# Patient Record
Sex: Male | Born: 1995 | Race: White | Hispanic: No | Marital: Single | Smoking: Never smoker
Health system: Southern US, Community
[De-identification: ages and names within clinical notes are randomized; demographics above are authoritative.]

## PROBLEM LIST (undated history)

## (undated) DIAGNOSIS — J45909 Unspecified asthma, uncomplicated: Secondary | ICD-10-CM

## (undated) HISTORY — PX: WISDOM TOOTH EXTRACTION: SHX21

---
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
## 2015-12-07 ENCOUNTER — Encounter: Payer: Self-pay | Admitting: Emergency Medicine

## 2015-12-07 ENCOUNTER — Ambulatory Visit
Admission: EM | Admit: 2015-12-07 | Discharge: 2015-12-07 | Disposition: A | Discharge status: Home / Self Care | Payer: BLUE CROSS/BLUE SHIELD | Attending: Family Medicine | Admitting: Family Medicine

## 2015-12-07 DIAGNOSIS — R0981 Nasal congestion: Secondary | ICD-10-CM | POA: Diagnosis not present

## 2015-12-07 DIAGNOSIS — H6982 Other specified disorders of Eustachian tube, left ear: Secondary | ICD-10-CM

## 2015-12-07 HISTORY — DX: Unspecified asthma, uncomplicated: J45.909

## 2015-12-07 MED ORDER — BENZONATATE 100 MG PO CAPS: 100.0000 mg | ORAL_CAPSULE | Freq: Three times a day (TID) | ORAL | Status: DC | PRN

## 2015-12-07 MED ORDER — FLUTICASONE PROPIONATE 50 MCG/ACT NA SUSP: 2.0000 | Freq: Every day | NASAL | Status: DC

## 2015-12-07 NOTE — ED Notes (Signed)
Patient c/o sinus pressure and congestion for the past 2 weeks.  Patient denies fevers.

## 2015-12-07 NOTE — Discharge Instructions (Signed)
Ceterizine ( Zyrtec) 10 mg 1 tablet twice daily for 2-3 days then reduce to daily  Flonase ( Fluticazone ) 1 spray per nostril per day ( OK twice daily for 3-5 days) Benzonatate 100 mg 1 -2 as needed for cough  Robitussin DM  ( guaifenesin DM ) 1 tsp every 4-6 hours to liquefy mucous  Delay antibiotic RX unless increased symptom despite therapy, increased pain or develop fever- \ice and snow weekend- difficult to follow up  Drink fluids--steamy showers--water on the [place] !!    Barotitis Media Barotitis media is inflammation of your middle ear. This occurs when the auditory tube (eustachian tube) leading from the back of your nose (nasopharynx) to your eardrum is blocked. This blockage may result from a cold, environmental allergies, or an upper respiratory infection. Unresolved barotitis media may lead to damage or hearing loss (barotrauma), which may become permanent. HOME CARE INSTRUCTIONS   Use medicines as recommended by your health care provider. Over-the-counter medicines will help unblock the canal and can help during times of air travel.  Do not put anything into your ears to clean or unplug them. Eardrops will not be helpful.  Do not swim, dive, or fly until your health care provider says it is all right to do so. If these activities are necessary, chewing gum with frequent, forceful swallowing may help. It is also helpful to hold your nose and gently blow to pop your ears for equalizing pressure changes. This forces air into the eustachian tube.  Only take over-the-counter or prescription medicines for pain, discomfort, or fever as directed by your health care provider.  A decongestant may be helpful in decongesting the middle ear and make pressure equalization easier. SEEK MEDICAL CARE IF:  You experience a serious form of dizziness in which you feel as if the room is spinning and you feel nauseated (vertigo).  Your symptoms only involve one ear. SEEK IMMEDIATE MEDICAL  CARE IF:   You develop a severe headache, dizziness, or severe ear pain.  You have bloody or pus-like drainage from your ears.  You develop a fever.  Your problems do not improve or become worse. MAKE SURE YOU:   Understand these instructions.  Will watch your condition.  Will get help right away if you are not doing well or get worse.   This information is not intended to replace advice given to you by your health care provider. Make sure you discuss any questions you have with your health care provider.   Document Released: 11/14/2000 Document Revised: 09/07/2013 Document Reviewed: 06/14/2013 Elsevier Interactive Patient Education 2016 [employer] Allergic Rhinitis Allergic rhinitis is when the mucous membranes in the nose respond to allergens. Allergens are particles in the air that cause your body to have an allergic reaction. This causes you to release allergic antibodies. Through a chain of events, these eventually cause you to release histamine into the blood stream. Although meant to protect the body, it is this release of histamine that causes your discomfort, such as frequent sneezing, congestion, and an itchy, runny nose.  CAUSES Seasonal allergic rhinitis (hay fever) is caused by pollen allergens that may come from grasses, trees, and weeds. Year-round allergic rhinitis (perennial allergic rhinitis) is caused by allergens such as house dust mites, pet dander, and mold spores. SYMPTOMS  Nasal stuffiness (congestion).  Itchy, runny nose with sneezing and tearing of the eyes. DIAGNOSIS Your health care provider can help you determine the allergen or allergens that trigger your symptoms. If you  and your health care provider are unable to determine the allergen, skin or blood testing may be used. Your health care provider will diagnose your condition after taking your health history and performing a physical exam. Your health care provider may assess you for other related  conditions, such as asthma, pink eye, or an ear infection. TREATMENT Allergic rhinitis does not have a cure, but it can be controlled by:  Medicines that block allergy symptoms. These may include allergy shots, nasal sprays, and oral antihistamines.  Avoiding the allergen. Hay fever may often be treated with antihistamines in pill or nasal spray forms. Antihistamines block the effects of histamine. There are over-the-counter medicines that may help with nasal congestion and swelling around the eyes. Check with your health care provider before taking or giving this medicine. If avoiding the allergen or the medicine prescribed do not work, there are many new medicines your health care provider can prescribe. Stronger medicine may be used if initial measures are ineffective. Desensitizing injections can be used if medicine and avoidance does not work. Desensitization is when a patient is given ongoing shots until the body becomes less sensitive to the allergen. Make sure you follow up with your health care provider if problems continue. HOME CARE INSTRUCTIONS It is not possible to completely avoid allergens, but you can reduce your symptoms by taking steps to limit your exposure to them. It helps to know exactly what you are allergic to so that you can avoid your specific triggers. SEEK MEDICAL CARE IF:  You have a fever.  You develop a cough that does not stop easily (persistent).  You have shortness of breath.  You start wheezing.  Symptoms interfere with normal daily activities.   This information is not intended to replace advice given to you by your health care provider. Make sure you discuss any questions you have with your health care provider.   Document Released: 08/12/2001 Document Revised: 12/08/2014 Document Reviewed: 07/25/2013 Elsevier Interactive Patient Education [employer]

## 2015-12-09 ENCOUNTER — Encounter: Payer: Self-pay | Admitting: Physician Assistant

## 2015-12-09 NOTE — ED Provider Notes (Signed)
CSN: [id number]     Arrival date & time 12/07/15  1545 History   First MD Initiated Contact with Patient 12/07/15 1736     Chief Complaint  Patient presents with  . Facial Pain  . Nasal Congestion   (Consider location/radiation/quality/duration/timing/severity/associated sxs/prior Treatment) HPI 20 yo M presents with his Dad reporting "about 2 weeks" of nasal congestion and "cold" .  In the last 2 days had developed left facial pressure, maxillary region and left ear pressure-- feels underwater. No fever, no malaise. Tired of having a cold. Nonsmoker. Post nasal drip with cough. 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  Past Medical History  Diagnosis Date  . Asthma    Past Surgical History  Procedure Laterality Date  . Wisdom tooth extraction     Family History  Problem Relation Age of Onset  . Asthma Father    Social History  Substance Use Topics  . Smoking status: Never Smoker   . Smokeless tobacco: None  . Alcohol Use: No    Review of Systems Constitutional: No fever. No headache. Eyes: No visual changes. ENT:No sore throat. Nasal congestion left greater than right Cardiovascular:Negative for chest pain/palpitations Respiratory: Negative for shortness of breath Gastrointestinal: No abdominal pain. No nausea,vomiting, diarrhea Genitourinary: Negative for dysuria. Normal urination. Musculoskeletal: Negative for back pain. FROM extremities without pain Skin: Negative for rash Neurological: Negative for headache, focal weakness or numbness  Allergies  Penicillins  Home Medications   Prior to Admission medications   Medication Sig Start Date End Date Taking? Authorizing Provider  benzonatate (TESSALON) 100 MG capsule Take 1 capsule (100 mg total) by mouth 3 (three) times daily as needed. 12/07/15   Rae HalstedLaurie W Jakeisha Stricker, PA-C  fluticasone [facility](FLONASE) 50 MCG/ACT nasal spray Place 2 sprays into both nostrils  daily. 12/07/15   Rae HalstedLaurie W Eris Breck, PA-C   Meds Ordered and Administered this Visit  Medications - No data to display  BP 121/67 mmHg  Pulse 81  Temp(Src) 98 F (36.7 C) (Tympanic)  Resp 16  Ht 6' (1.829 m)  Wt 150 lb (68.04 kg)  BMI 20.34 kg/m2  SpO2 100% No data found.   Physical Exam  General: NAD, does not appear toxic - Afebrile- pressure has been for 24 hours, OTC intervention not yet used HEENT:normocephalic,atraumatic, mucous membranes moist,grossly normal hearing; left TM mildly retracted and dull; Right canal and TM WNL Eyes: EOMI, conjunctiva clear, conjugate gaze Neck: supple,no lymphadenopathy Resp : CT A, bilat; normal respiratory effort Card : RRR Abd:  Not distended Skin: no rash, skin intact MSK: no deformities, ambulatory without assistance Neuro : good attention,recall-good memory, no focal neuro deficits Psych: speech and behavior appropriate   ED Course  Procedures (including critical care time)  Labs Review Labs Reviewed - No data to display  Imaging Review No results found.   MDM   1. Eustachian tube dysfunction, left   2. Nasal congestion    Plan: diagnosis reviewed with patient/parent- illustrations reviewed Add Zyrtec/Flonase and Benzonatate - afebrile , pressure only 1 day without interventions- defer antibiotics Rx as per orders;  benefits, risks, potential side effects reviewed   Recommend supportive treatment with cyclic tylenol and ibuprofen Seek additional medical care if symptoms worsen or are not improving     Rae HalstedLaurie W Thanvi Blincoe, PA-C 12/09/15 2156

## 2016-09-12 ENCOUNTER — Ambulatory Visit
Admission: EM | Admit: 2016-09-12 | Discharge: 2016-09-12 | Disposition: A | Discharge status: Home / Self Care | Payer: Worker's Compensation | Attending: Family Medicine | Admitting: Family Medicine

## 2016-09-12 ENCOUNTER — Encounter: Payer: Self-pay | Admitting: Gynecology

## 2016-09-12 DIAGNOSIS — M545 Low back pain, unspecified: Secondary | ICD-10-CM

## 2016-09-12 MED ORDER — IBUPROFEN 800 MG PO TABS: 800.0000 mg | ORAL_TABLET | Freq: Three times a day (TID) | ORAL | 0 refills | Status: DC | PRN

## 2016-09-12 MED ORDER — CYCLOBENZAPRINE HCL 10 MG PO TABS: 10.0000 mg | ORAL_TABLET | Freq: Three times a day (TID) | ORAL | 0 refills | Status: DC | PRN

## 2016-09-12 MED ORDER — KETOROLAC TROMETHAMINE 60 MG/2ML IM SOLN
60.0000 mg | Freq: Once | INTRAMUSCULAR | Status: AC
  Administered 2016-09-12: 60 mg via INTRAMUSCULAR

## 2016-09-12 NOTE — ED Triage Notes (Signed)
Patient stated while at work today injury his lower back. Per patient after swinging a sledge hammer at work his lower back start hurting.

## 2016-09-12 NOTE — ED Provider Notes (Signed)
[facility] ____________________________________________  Time seen: Approximately 6:18 PM  I have reviewed the triage vital signs and the nursing notes.   HISTORY  Chief Complaint Back Pain   HPI  Thomas Huff is a 20 y.o. male present with father at bedside for the complaints of low back pain. Patient reports that earlier today personally 9 AM he was at work, and states that he had to swing a sledgehammer and had onset of low back pain during this activity. Patient reports low back pain has continued. Patient states pain is primarily with movement. Patient states if he sits completely still he does not have any pain. Denies any pain with bending forward or sitting back up, but reports pain is primarily with twisting movements. Denies any pain radiation. Denies any numbness or tingling sensation. Patient reports he took ibuprofen over-the-counter earlier today without any resolution. Patient states at this time sitting still he does not have any pain. Denies any fall to the ground, direct trauma or other injury. Denies any awkward movement. Denies any history of back pain or problems. Denies head injury or loss consciousness.  Denies urinary or bowel retention or incontinence, extremity pain, extremity weakness, paresthesias, abdominal pain or other complaints.   Past Medical History:  Diagnosis Date  . Asthma     There are no active problems to display for this patient.   Past Surgical History:  Procedure Laterality Date  . WISDOM TOOTH EXTRACTION      Current Outpatient Rx  . Order #: [id number] Class: Normal  . Order #: [id number] Class: Normal     Current Facility-Administered Medications:  .  ketorolac (TORADOL) injection 60 mg, 60 mg, Intramuscular, Once, Renford Dills, NP  Current Outpatient Prescriptions:  .  benzonatate (TESSALON) 100 MG capsule, Take 1 capsule (100 mg total) by mouth 3 (three) times daily as needed., Disp: 30 capsule, Rfl: 0 .   fluticasone (FLONASE) 50 MCG/ACT nasal spray, Place 2 sprays into both nostrils daily., Disp: 16 g, Rfl: 2  Allergies Penicillins  Family History  Problem Relation Age of Onset  . Asthma Father     Social History Social History  Substance Use Topics  . Smoking status: Never Smoker  . Smokeless tobacco: Never Used  . Alcohol use No    Review of Systems Constitutional: No fever/chills Eyes: No visual changes. ENT: No sore throat. Cardiovascular: Denies chest pain. Respiratory: Denies shortness of breath. Gastrointestinal: No abdominal pain.  No nausea, no vomiting.  No diarrhea.  No constipation. Genitourinary: Negative for dysuria. Musculoskeletal: Positive for back pain. Skin: Negative for rash. Neurological: Negative for headaches, focal weakness or numbness.  10-point ROS otherwise negative.  ____________________________________________   PHYSICAL EXAM:  VITAL SIGNS: ED Triage Vitals  Enc Vitals Group     BP 09/12/16 1754 123/66     Pulse Rate 09/12/16 1754 69     Resp 09/12/16 1754 16     Temp 09/12/16 1754 97.9 F (36.6 C)     Temp Source 09/12/16 1754 Oral     SpO2 09/12/16 1754 100 %     Weight 09/12/16 1755 160 lb (72.6 kg)     Height 09/12/16 1755 6' (1.829 m)     Head Circumference --      Peak Flow --      Pain Score 09/12/16 1756 6     Pain Loc --      Pain Edu? --      Excl. in GC? --  Constitutional: Alert and oriented. Well appearing and in no acute distress. Eyes: Conjunctivae are normal. PERRL. EOMI. ENT      Head: Normocephalic and atraumatic.      Nose: No congestion/rhinnorhea.      Mouth/Throat: Mucous membranes are moist.Oropharynx non-erythematous. Neck: No stridor. Supple without meningismus.  Hematological/Lymphatic/Immunilogical: No cervical lymphadenopathy. Cardiovascular: Normal rate, regular rhythm. Grossly normal heart sounds.  Good peripheral circulation. Respiratory: Normal respiratory effort without tachypnea nor  retractions. Breath sounds are clear and equal bilaterally. No wheezes/rales/rhonchi.. Gastrointestinal: Soft and nontender. No distention. No CVA tenderness. Musculoskeletal:  Nontender with normal range of motion in all extremities. No midline cervical Or thoracic tenderness to palpation. Bilateral pedal pulses equal and easily palpated. Except: Patient with minimal midline lumbar tenderness to palpation, diffuse bilateral paralumbar lumbar tenderness to direct palpation, muscle spasm noted to bilateral paralumbar musculature noted with right and left lumbar twisting rotation, no pain with lumbar flexion or extension, full range of motion present, no saddle anesthesia, normal bilateral achilles reflexes, negative bilateral straight leg raise test negative , changes positions quickly from lying to standing.  Neurologic:  Normal speech and language. No gross focal neurologic deficits are appreciated. Speech is normal. No gait instability.  Skin:  Skin is warm, dry and intact. No rash noted. Psychiatric: Mood and affect are normal. Speech and behavior are normal. Patient exhibits appropriate insight and judgment   ___________________________________________   LABS (all labs ordered are listed, but only abnormal results are displayed)  Labs Reviewed - No data to display  RADIOLOGY  No results found. ____________________________________________   PROCEDURES Procedures   __________________________________________   INITIAL IMPRESSION / ASSESSMENT AND PLAN / ED COURSE  Pertinent labs & imaging results that were available during my care of the patient were reviewed by me and considered in my medical decision making (see chart for details).  Well-appearing patient. No acute distress. Presents for the complaints of low back pain after swimming a sledgehammer at work this morning. Denies fall or direct trauma. Denies history of similar. 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 Patient  denies pain when sitting still at rest. Patient states pain is primarily with right and left rotation and twisting movements. Minimal midline tenderness, palpable muscle spasms on exam, per patient pain is fully reproducible by direct palpation to bilateral paralumbar musculature. Discussed in detail with patient and father regarding evaluation and treatment, patient and father declines x-ray at this time and states will follow-up and have x-ray if pain continues.   60 mg IM Toradol given once in urgent care. Suspect lumbar strain. Will treat patient with oral ibuprofen as needed as well as Flexeril as needed. Patient reports he has this weekend off of work. Directed to take muscle relaxant as prescribed over the weekend, however discuss once returning to work only take muscle relaxant at night as needed. Encouraged supportive care, rest, ice and stretching. Encouraged patient to follow-up this coming week with Francesco Sorommie Anne Moore FNP, and further stated that restrictions including no heavy lifting, climbing or bending to stay in effect until follow-up. Discussed indication, risks and benefits of medications with patient.  Discussed follow up with Primary care physician this week. Discussed follow up and return parameters including no resolution or any worsening concerns. Patient verbalized understanding and agreed to plan.   ____________________________________________   FINAL CLINICAL IMPRESSION(S) / ED DIAGNOSES  Final diagnoses:  Strain of lumbar region, initial encounter     New Prescriptions   No medications on file  Note: This dictation was prepared with Dragon dictation along with smaller phrase technology. Any transcriptional errors that result from this process are unintentional.    Clinical Course      Renford Dills, NP 09/12/16 2148

## 2016-09-12 NOTE — Discharge Instructions (Signed)
Take medication as prescribed. Rest. Stretch.   Follow up with Tommie Maximiano Coss FNP this coming week.   Follow up with your primary care physician this week as needed. Return to Urgent care for new or worsening concerns.

## 2017-12-25 ENCOUNTER — Emergency Department: Payer: Worker's Compensation

## 2017-12-25 ENCOUNTER — Encounter: Payer: Self-pay | Admitting: Emergency Medicine

## 2017-12-25 ENCOUNTER — Emergency Department
Admission: EM | Admit: 2017-12-25 | Discharge: 2017-12-25 | Disposition: A | Discharge status: Home / Self Care | Payer: Worker's Compensation | Attending: Emergency Medicine | Admitting: Emergency Medicine

## 2017-12-25 ENCOUNTER — Other Ambulatory Visit: Payer: Self-pay

## 2017-12-25 DIAGNOSIS — J45909 Unspecified asthma, uncomplicated: Secondary | ICD-10-CM | POA: Insufficient documentation

## 2017-12-25 DIAGNOSIS — S62339A Displaced fracture of neck of unspecified metacarpal bone, initial encounter for closed fracture: Secondary | ICD-10-CM | POA: Insufficient documentation

## 2017-12-25 DIAGNOSIS — Y939 Activity, unspecified: Secondary | ICD-10-CM | POA: Insufficient documentation

## 2017-12-25 DIAGNOSIS — Z880 Allergy status to penicillin: Secondary | ICD-10-CM | POA: Insufficient documentation

## 2017-12-25 DIAGNOSIS — W010XXA Fall on same level from slipping, tripping and stumbling without subsequent striking against object, initial encounter: Secondary | ICD-10-CM | POA: Insufficient documentation

## 2017-12-25 DIAGNOSIS — Y929 Unspecified place or not applicable: Secondary | ICD-10-CM | POA: Diagnosis not present

## 2017-12-25 DIAGNOSIS — Z79899 Other long term (current) drug therapy: Secondary | ICD-10-CM | POA: Insufficient documentation

## 2017-12-25 DIAGNOSIS — S6992XA Unspecified injury of left wrist, hand and finger(s), initial encounter: Secondary | ICD-10-CM | POA: Diagnosis present

## 2017-12-25 DIAGNOSIS — Y998 Other external cause status: Secondary | ICD-10-CM | POA: Insufficient documentation

## 2017-12-25 MED ORDER — IBUPROFEN 800 MG PO TABS: 800.0000 mg | ORAL_TABLET | Freq: Three times a day (TID) | ORAL | 0 refills | Status: DC | PRN

## 2017-12-25 MED ORDER — OXYCODONE-ACETAMINOPHEN 5-325 MG PO TABS: 1.0000 | ORAL_TABLET | ORAL | 0 refills | Status: DC | PRN

## 2017-12-25 MED ORDER — FENTANYL CITRATE (PF) 100 MCG/2ML IJ SOLN
50.0000 ug | Freq: Once | INTRAMUSCULAR | Status: AC
  Administered 2017-12-25: 50 ug via INTRAVENOUS
  Filled 2017-12-25: qty 2

## 2017-12-25 NOTE — ED Provider Notes (Signed)
[facility] Emergency Department Provider Note  ____________________________________________  Time seen: Approximately 4:01 PM  I have reviewed the triage vital signs and the nursing notes.   HISTORY  Chief Complaint Hand Injury    HPI DEWAUN KINZLER is a 22 y.o. male that presents to the emergency department for evaluation of hand injury at work. Patient slipped and put his hand out to catch himself on a machine. No additional injuries. No numbness, tingling.    Past Medical History:  Diagnosis Date  . Asthma     There are no active problems to display for this patient.   Past Surgical History:  Procedure Laterality Date  . WISDOM TOOTH EXTRACTION      Prior to Admission medications   Medication Sig Start Date End Date Taking? Authorizing Provider  benzonatate (TESSALON) 100 MG capsule Take 1 capsule (100 mg total) by mouth 3 (three) times daily as needed. 12/07/15   Rae Halsted, PA-C  cyclobenzaprine (FLEXERIL) 10 MG tablet Take 1 tablet (10 mg total) by mouth 3 (three) times daily as needed for muscle spasms (do not drive or operate machinery while taking as can cause drowsiness. Do not take at work.). 09/12/16   Renford Dills, NP  fluticasone (FLONASE) 50 MCG/ACT nasal spray Place 2 sprays into both nostrils daily. 12/07/15   Rae Halsted, PA-C  ibuprofen (ADVIL,MOTRIN) 800 MG tablet Take 1 tablet (800 mg total) by mouth every 8 (eight) hours as needed. 12/25/17   Enid Derry, PA-C  oxyCODONE-acetaminophen (PERCOCET) 5-325 MG tablet Take 1 tablet by mouth every 4 (four) hours as needed for severe pain. 12/25/17   Enid Derry, PA-C    Allergies Penicillins  Family History  Problem Relation Age of Onset  . Asthma Father     Social History Social History   Tobacco Use  . Smoking status: Never Smoker  . Smokeless tobacco: Never Used  Substance Use Topics  . Alcohol use: No  . Drug use: Not on file     Review of Systems   Cardiovascular: No chest pain. Respiratory: No SOB. Musculoskeletal: Positive for hand pain Skin: Negative for rash, abrasions, lacerations, ecchymosis. Neurological: Negative for numbness or tingling   ____________________________________________   PHYSICAL EXAM:  VITAL SIGNS: ED Triage Vitals  Enc Vitals Group     BP 12/25/17 1544 (!) 157/87     Pulse Rate 12/25/17 1544 79     Resp 12/25/17 1544 16     Temp 12/25/17 1544 98.5 F (36.9 C)     Temp Source 12/25/17 1544 Oral     SpO2 12/25/17 1544 100 %     Weight 12/25/17 1545 160 lb (72.6 kg)     Height 12/25/17 1545 6' (1.829 m)     Head Circumference --      Peak Flow --      Pain Score 12/25/17 1545 6     Pain Loc --      Pain Edu? --      Excl. in GC? --      Constitutional: Alert and oriented. Well appearing and in no acute distress. Eyes: Conjunctivae are normal. PERRL. EOMI. Head: Atraumatic. ENT:      Ears:      Nose: No congestion/rhinnorhea.      Mouth/Throat: Mucous membranes are moist.  Neck: No stridor. Cardiovascular: Normal rate, regular rhythm.  Good peripheral circulation.  Symmetric radial pulses bilaterally. Respiratory: Normal respiratory effort without tachypnea or retractions. Lungs CTAB. Good air entry  to the bases with no decreased or absent breath sounds. Musculoskeletal: Full range of motion to all extremities.  Tenderness to palpation over fifth metacarpal.  No visible swelling. Neurologic:  Normal speech and language. No gross focal neurologic deficits are appreciated.  Skin:  Skin is warm, dry and intact. No rash noted.   ____________________________________________   LABS (all labs ordered are listed, but only abnormal results are displayed)  Labs Reviewed - No data to display ____________________________________________  EKG   ____________________________________________  RADIOLOGY Lexine BatonI, Araeya Lamb, personally viewed and evaluated these images (plain radiographs) as part  of my medical decision making, as well as reviewing the written report by the radiologist. Fifth metacarpal fracture Dg Hand Complete Left  Result Date: 12/25/2017 CLINICAL DATA:  Hand injury with pain. EXAM: LEFT HAND - COMPLETE 3+ VIEW COMPARISON:  None. FINDINGS: Transverse fracture of the fifth metacarpal noted near the junction of the middle and distal thirds. There is apex posterior angulation. No evidence for bony over riding. No other acute fracture evident no worrisome lytic or sclerotic osseous abnormality. IMPRESSION: Diaphyseal fracture of the fifth metacarpal with apex posterior angulation. Electronically Signed   By: Kennith CenterEric  Mansell M.D.   On: 12/25/2017 16:21    ____________________________________________    PROCEDURES  Procedure(s) performed:    Procedures    Medications  fentaNYL (SUBLIMAZE) injection 50 mcg (50 mcg Intravenous Given 12/25/17 1615)     ____________________________________________   INITIAL IMPRESSION / ASSESSMENT AND PLAN / ED COURSE  Pertinent labs & imaging results that were available during my care of the patient were reviewed by me and considered in my medical decision making (see chart for details).  Review of the [place] CSRS was performed in accordance of the [facility] prior to dispensing any controlled drugs.  PatientPatient's diagnosis is consistent with fifth metacarpal fracture.  Vital signs and exam are reassuring.  X-ray consistent with fracture.  Hand is neurovascularly intact.  Ulnar gutter splint was placed.  Sling was given.  Patient will be discharged home with prescriptions for ibuprofen and a short course of Percocet. Patient is to follow up with orthopedics as directed. Patient is given ED precautions to return to the ED for any worsening or new symptoms.     ____________________________________________  FINAL CLINICAL IMPRESSION(S) / ED DIAGNOSES  Final diagnoses:  Closed boxer's fracture, initial encounter      NEW  MEDICATIONS STARTED DURING THIS VISIT:  ED Discharge Orders        Ordered    ibuprofen (ADVIL,MOTRIN) 800 MG tablet  Every 8 hours PRN     12/25/17 1707    oxyCODONE-acetaminophen (PERCOCET) 5-325 MG tablet  Every 4 hours PRN     12/25/17 1707          This chart was dictated using voice recognition software/Dragon. Despite best efforts to proofread, errors can occur which can change the meaning. Any change was purely unintentional.    Enid DerryWagner, Carisa Backhaus, PA-C 12/25/17 1959    Minna AntisPaduchowski, Kevin, MD 12/25/17 2329

## 2017-12-25 NOTE — ED Triage Notes (Signed)
Pt to ED via POV, pt was at work today and slipped and fell, pt put his hand out trying to catch himself and hurt his hand. Pt in NAD at this time.

## 2018-01-11 ENCOUNTER — Encounter
Admission: RE | Admit: 2018-01-11 | Discharge: 2018-01-11 | Disposition: A | Discharge status: Home / Self Care | Payer: Self-pay | Source: Ambulatory Visit | Attending: Orthopedic Surgery | Admitting: Orthopedic Surgery

## 2018-01-11 ENCOUNTER — Other Ambulatory Visit: Payer: Self-pay

## 2018-01-11 NOTE — Patient Instructions (Signed)
Your procedure is scheduled on: 01-14-18 THURSDAY Report to Same Day Surgery 2nd floor medical mall [facility](Medical Mall Entrance-take elevator on left to 2nd floor.  Check in with surgery information desk.) To find out your arrival time please call [phone] between 1PM - 3PM on [facility]  Remember: Instructions that are not followed completely may result in serious medical risk, up to and including death, or upon the discretion of your surgeon and anesthesiologist your surgery may need to be rescheduled.    _x___ 1. Do not eat food after midnight the night before your procedure. NO GUM OR CANDY AFTER MIDNIGHT.  You may drink clear liquids up to 2 hours before you are scheduled to arrive at the hospital for your procedure.  Do not drink clear liquids within 2 hours of your scheduled arrival to the hospital.  Clear liquids include  --Water or Apple juice without pulp  --Clear carbohydrate beverage such as ClearFast or Gatorade  --Black Coffee or Clear Tea (No milk, no creamers, do not add anything to the coffee or Tea)     __x__ 2. No Alcohol for 24 hours before or after surgery.   __x__3. No Smoking or e-cigarettes for 24 prior to surgery.  Do not use any chewable tobacco products for at least 6 hour prior to surgery   ____  4. Bring all medications with you on the day of surgery if instructed.    __x__ 5. Notify your doctor if there is any change in your medical condition     (cold, fever, infections).    x___6. On the morning of surgery brush your teeth with toothpaste and water.  You may rinse your mouth with mouth wash if you wish.  Do not swallow any toothpaste or mouthwash.   Do not wear jewelry, make-up, hairpins, clips or nail polish.  Do not wear lotions, powders, or perfumes. You may wear deodorant.  Do not shave 48 hours prior to surgery. Men may shave face and neck.  Do not bring valuables to the hospital.    [facility] belongings or  valuables.               Contacts, dentures or bridgework may not be worn into surgery.  Leave your suitcase in the car. After surgery it may be brought to your room.  For patients admitted to the hospital, discharge time is determined by your treatment team.  _  Patients discharged the day of surgery will not be allowed to drive home.  You will need someone to drive you home and stay with you the night of your procedure.     ____ Take anti-hypertensive listed below, cardiac, seizure, asthma, anti-reflux and psychiatric medicines. These include:  1. USE YOUR ALBUTEROL INHALER AM OF SURGERY AND BRING INHALER TO HOSPITAL  2.  3.  4.  5.  6.  ____Fleets enema or Magnesium Citrate as directed.   ____ Use CHG Soap or sage wipes as directed on instruction sheet   __X__ Use inhalers on the day of surgery and bring to hospital day of surgery-USE YOUR ALBUTEROL INHALER AND BRING TO HOSPITAL  ____ Stop Metformin and Janumet 2 days prior to surgery.    ____ Take 1/2 of usual insulin dose the night before surgery and none on the morning surgery.   ____ Follow recommendations from Cardiologist, Pulmonologist or PCP regarding stopping Aspirin, Coumadin, Plavix ,Eliquis, Effient, or Pradaxa, and Pletal.  X____Stop Anti-inflammatories such as Advil, Aleve,  Ibuprofen, Motrin, Naproxen, Naprosyn, Goodies powders or aspirin products NOW-OK to take Tylenol    ____ Stop supplements until after surgery.    ____ Bring C-Pap to the hospital.

## 2018-01-13 MED ORDER — CLINDAMYCIN PHOSPHATE 900 MG/50ML IV SOLN: 900.0000 mg | Freq: Once | INTRAVENOUS | Status: DC

## 2018-01-14 ENCOUNTER — Encounter
Admission: RE | Disposition: A | Discharge status: Home / Self Care | Payer: Self-pay | Source: Ambulatory Visit | Attending: Orthopedic Surgery

## 2018-01-14 ENCOUNTER — Ambulatory Visit: Payer: Worker's Compensation | Admitting: Anesthesiology

## 2018-01-14 ENCOUNTER — Other Ambulatory Visit: Payer: Self-pay

## 2018-01-14 ENCOUNTER — Encounter: Payer: Self-pay | Admitting: Anesthesiology

## 2018-01-14 ENCOUNTER — Ambulatory Visit
Admission: RE | Admit: 2018-01-14 | Discharge: 2018-01-14 | Disposition: A | Discharge status: Home / Self Care | Payer: Worker's Compensation | Source: Ambulatory Visit | Attending: Orthopedic Surgery | Admitting: Orthopedic Surgery

## 2018-01-14 ENCOUNTER — Ambulatory Visit: Payer: Worker's Compensation

## 2018-01-14 DIAGNOSIS — Z8781 Personal history of (healed) traumatic fracture: Secondary | ICD-10-CM

## 2018-01-14 DIAGNOSIS — Y990 Civilian activity done for income or pay: Secondary | ICD-10-CM | POA: Diagnosis not present

## 2018-01-14 DIAGNOSIS — W19XXXA Unspecified fall, initial encounter: Secondary | ICD-10-CM | POA: Insufficient documentation

## 2018-01-14 DIAGNOSIS — Z9889 Other specified postprocedural states: Secondary | ICD-10-CM

## 2018-01-14 DIAGNOSIS — Z79899 Other long term (current) drug therapy: Secondary | ICD-10-CM | POA: Insufficient documentation

## 2018-01-14 DIAGNOSIS — J45909 Unspecified asthma, uncomplicated: Secondary | ICD-10-CM | POA: Diagnosis not present

## 2018-01-14 DIAGNOSIS — S62357A Nondisplaced fracture of shaft of fifth metacarpal bone, left hand, initial encounter for closed fracture: Secondary | ICD-10-CM | POA: Insufficient documentation

## 2018-01-14 DIAGNOSIS — Y9289 Other specified places as the place of occurrence of the external cause: Secondary | ICD-10-CM | POA: Diagnosis not present

## 2018-01-14 HISTORY — PX: OPEN REDUCTION INTERNAL FIXATION (ORIF) METACARPAL: SHX6234

## 2018-01-14 SURGERY — OPEN REDUCTION INTERNAL FIXATION (ORIF) METACARPAL
Anesthesia: General | Site: Hand | Laterality: Left | Wound class: Clean

## 2018-01-14 MED ORDER — FENTANYL CITRATE (PF) 100 MCG/2ML IJ SOLN
25.0000 ug | INTRAMUSCULAR | Status: DC | PRN
  Administered 2018-01-14 (×4): 25 ug via INTRAVENOUS

## 2018-01-14 MED ORDER — SODIUM CHLORIDE 0.9 % IV SOLN: INTRAVENOUS | Status: DC

## 2018-01-14 MED ORDER — DEXAMETHASONE SODIUM PHOSPHATE 10 MG/ML IJ SOLN
INTRAMUSCULAR | Status: AC
  Filled 2018-01-14: qty 1

## 2018-01-14 MED ORDER — SUGAMMADEX SODIUM 200 MG/2ML IV SOLN
INTRAVENOUS | Status: AC
  Filled 2018-01-14: qty 2

## 2018-01-14 MED ORDER — FENTANYL CITRATE (PF) 100 MCG/2ML IJ SOLN
INTRAMUSCULAR | Status: DC | PRN
  Administered 2018-01-14: 100 ug via INTRAVENOUS

## 2018-01-14 MED ORDER — LIDOCAINE HCL (PF) 2 % IJ SOLN
INTRAMUSCULAR | Status: AC
  Filled 2018-01-14: qty 10

## 2018-01-14 MED ORDER — SUGAMMADEX SODIUM 200 MG/2ML IV SOLN
INTRAVENOUS | Status: DC | PRN
  Administered 2018-01-14: 156 mg via INTRAVENOUS

## 2018-01-14 MED ORDER — NEOMYCIN-POLYMYXIN B GU 40-200000 IR SOLN
Status: DC | PRN
  Administered 2018-01-14: 2 mL

## 2018-01-14 MED ORDER — FENTANYL CITRATE (PF) 100 MCG/2ML IJ SOLN
INTRAMUSCULAR | Status: AC
  Filled 2018-01-14: qty 2

## 2018-01-14 MED ORDER — HYDROCODONE-ACETAMINOPHEN 5-325 MG PO TABS
1.0000 | ORAL_TABLET | ORAL | Status: DC | PRN
  Administered 2018-01-14: 1 via ORAL

## 2018-01-14 MED ORDER — LIDOCAINE HCL (CARDIAC) 20 MG/ML IV SOLN
INTRAVENOUS | Status: DC | PRN
  Administered 2018-01-14: 100 mg via INTRAVENOUS

## 2018-01-14 MED ORDER — ONDANSETRON HCL 4 MG PO TABS: 4.0000 mg | ORAL_TABLET | Freq: Four times a day (QID) | ORAL | Status: DC | PRN

## 2018-01-14 MED ORDER — DEXAMETHASONE SODIUM PHOSPHATE 10 MG/ML IJ SOLN
INTRAMUSCULAR | Status: DC | PRN
  Administered 2018-01-14: 10 mg via INTRAVENOUS

## 2018-01-14 MED ORDER — ONDANSETRON HCL 4 MG/2ML IJ SOLN: 4.0000 mg | Freq: Four times a day (QID) | INTRAMUSCULAR | Status: DC | PRN

## 2018-01-14 MED ORDER — FAMOTIDINE 20 MG PO TABS
ORAL_TABLET | ORAL | Status: AC
  Filled 2018-01-14: qty 1

## 2018-01-14 MED ORDER — ONDANSETRON HCL 4 MG/2ML IJ SOLN: 4.0000 mg | Freq: Once | INTRAMUSCULAR | Status: DC | PRN

## 2018-01-14 MED ORDER — FENTANYL CITRATE (PF) 100 MCG/2ML IJ SOLN
INTRAMUSCULAR | Status: AC
  Administered 2018-01-14: 25 ug via INTRAVENOUS
  Filled 2018-01-14: qty 2

## 2018-01-14 MED ORDER — MIDAZOLAM HCL 2 MG/2ML IJ SOLN
INTRAMUSCULAR | Status: AC
  Filled 2018-01-14: qty 2

## 2018-01-14 MED ORDER — METOCLOPRAMIDE HCL 5 MG/ML IJ SOLN: 5.0000 mg | Freq: Three times a day (TID) | INTRAMUSCULAR | Status: DC | PRN

## 2018-01-14 MED ORDER — ROCURONIUM BROMIDE 50 MG/5ML IV SOLN
INTRAVENOUS | Status: AC
  Filled 2018-01-14: qty 1

## 2018-01-14 MED ORDER — ONDANSETRON HCL 4 MG/2ML IJ SOLN
INTRAMUSCULAR | Status: DC | PRN
  Administered 2018-01-14: 4 mg via INTRAVENOUS

## 2018-01-14 MED ORDER — MIDAZOLAM HCL 2 MG/2ML IJ SOLN
INTRAMUSCULAR | Status: DC | PRN
  Administered 2018-01-14: 2 mg via INTRAVENOUS

## 2018-01-14 MED ORDER — PROPOFOL 10 MG/ML IV BOLUS
INTRAVENOUS | Status: AC
  Filled 2018-01-14: qty 20

## 2018-01-14 MED ORDER — BUPIVACAINE HCL (PF) 0.5 % IJ SOLN
INTRAMUSCULAR | Status: DC | PRN
  Administered 2018-01-14: 10 mL

## 2018-01-14 MED ORDER — BUPIVACAINE HCL (PF) 0.5 % IJ SOLN
INTRAMUSCULAR | Status: AC
  Filled 2018-01-14: qty 30

## 2018-01-14 MED ORDER — METOCLOPRAMIDE HCL 10 MG PO TABS: 5.0000 mg | ORAL_TABLET | Freq: Three times a day (TID) | ORAL | Status: DC | PRN

## 2018-01-14 MED ORDER — ONDANSETRON HCL 4 MG/2ML IJ SOLN
INTRAMUSCULAR | Status: AC
  Filled 2018-01-14: qty 2

## 2018-01-14 MED ORDER — CLINDAMYCIN PHOSPHATE 900 MG/50ML IV SOLN
INTRAVENOUS | Status: AC
  Filled 2018-01-14: qty 50

## 2018-01-14 MED ORDER — FAMOTIDINE 20 MG PO TABS
20.0000 mg | ORAL_TABLET | Freq: Once | ORAL | Status: AC
  Administered 2018-01-14: 20 mg via ORAL

## 2018-01-14 MED ORDER — SUCCINYLCHOLINE CHLORIDE 20 MG/ML IJ SOLN
INTRAMUSCULAR | Status: AC
Start: 2018-01-14 — End: ?
  Filled 2018-01-14: qty 1

## 2018-01-14 MED ORDER — HYDROCODONE-ACETAMINOPHEN 5-325 MG PO TABS
ORAL_TABLET | ORAL | Status: AC
  Filled 2018-01-14: qty 1

## 2018-01-14 MED ORDER — SUCCINYLCHOLINE CHLORIDE 20 MG/ML IJ SOLN
INTRAMUSCULAR | Status: DC | PRN
  Administered 2018-01-14: 120 mg via INTRAVENOUS

## 2018-01-14 MED ORDER — ROCURONIUM BROMIDE 100 MG/10ML IV SOLN
INTRAVENOUS | Status: DC | PRN
  Administered 2018-01-14: 50 mg via INTRAVENOUS

## 2018-01-14 MED ORDER — NEOMYCIN-POLYMYXIN B GU 40-200000 IR SOLN
Status: AC
  Filled 2018-01-14: qty 2

## 2018-01-14 MED ORDER — LACTATED RINGERS IV SOLN
INTRAVENOUS | Status: DC
  Administered 2018-01-14 (×2): via INTRAVENOUS

## 2018-01-14 MED ORDER — PROPOFOL 10 MG/ML IV BOLUS
INTRAVENOUS | Status: DC | PRN
  Administered 2018-01-14: 200 mg via INTRAVENOUS

## 2018-01-14 SURGICAL SUPPLY — 42 items
BANDAGE ELASTIC 4 LF NS (GAUZE/BANDAGES/DRESSINGS) ×3 IMPLANT
BIT DRILL 1.1 (BIT) ×1
BIT DRILL 1.1MM (BIT) ×1
BIT DRILL 60X20X1.1XQC TMX (BIT) ×1 IMPLANT
BIT DRL 60X20X1.1XQC TMX (BIT) ×1
BNDG GAUZE 1X2.1 STRL (MISCELLANEOUS) ×3 IMPLANT
CHLORAPREP W/TINT 26ML (MISCELLANEOUS) ×3 IMPLANT
CUFF TOURN 18 STER (MISCELLANEOUS) IMPLANT
DRAPE FLUOR MINI C-ARM 54X84 (DRAPES) ×3 IMPLANT
ELECT CAUTERY BLADE 6.4 (BLADE) ×3 IMPLANT
ELECT REM PT RETURN 9FT ADLT (ELECTROSURGICAL) ×3
ELECTRODE REM PT RTRN 9FT ADLT (ELECTROSURGICAL) ×1 IMPLANT
GAUZE PETRO XEROFOAM 1X8 (MISCELLANEOUS) ×3 IMPLANT
GAUZE SPONGE 4X4 12PLY STRL (GAUZE/BANDAGES/DRESSINGS) ×3 IMPLANT
GLOVE BIOGEL PI IND STRL 7.0 (GLOVE) ×2 IMPLANT
GLOVE BIOGEL PI INDICATOR 7.0 (GLOVE) ×4
GLOVE SURG SYN 9.0  PF PI (GLOVE) ×2
GLOVE SURG SYN 9.0 PF PI (GLOVE) ×1 IMPLANT
GOWN SRG 2XL LVL 4 RGLN SLV (GOWNS) ×1 IMPLANT
GOWN STRL NON-REIN 2XL LVL4 (GOWNS) ×2
GOWN STRL REUS W/ TWL LRG LVL3 (GOWN DISPOSABLE) ×1 IMPLANT
GOWN STRL REUS W/TWL LRG LVL3 (GOWN DISPOSABLE) ×2
KIT TURNOVER KIT A (KITS) ×3 IMPLANT
NEEDLE FILTER BLUNT 18X 1/2SAF (NEEDLE) ×2
NEEDLE FILTER BLUNT 18X1 1/2 (NEEDLE) ×1 IMPLANT
NS IRRIG 500ML POUR BTL (IV SOLUTION) ×3 IMPLANT
PACK EXTREMITY ARMC (MISCELLANEOUS) ×3 IMPLANT
PAD PREP 24X41 OB/GYN DISP (PERSONAL CARE ITEMS) ×3 IMPLANT
PADDING CAST BLEND 4X4 NS (MISCELLANEOUS) ×3 IMPLANT
PLATE STRAIGHT LOCK 1.5 (Plate) ×3 IMPLANT
SCREW L 1.5X14 (Screw) ×6 IMPLANT
SCREW NL 1.5X11 WRIST (Screw) ×6 IMPLANT
SCREW NL 1.5X12 (Screw) ×3 IMPLANT
SCREW NONIOC 1.5 10M (Screw) ×3 IMPLANT
SPLINT CAST 1 STEP 3X12 (MISCELLANEOUS) ×3 IMPLANT
SUT ETHIBOND 4-0 (SUTURE) IMPLANT
SUT ETHILON 4-0 (SUTURE) ×2
SUT ETHILON 4-0 FS2 18XMFL BLK (SUTURE) ×1
SUT ETHILON 5 0 CL P 3 (SUTURE) IMPLANT
SUT VIC AB 3-0 SH 27 (SUTURE) ×2
SUT VIC AB 3-0 SH 27X BRD (SUTURE) ×1 IMPLANT
SUTURE ETHLN 4-0 FS2 18XMF BLK (SUTURE) ×1 IMPLANT

## 2018-01-14 NOTE — H&P (Signed)
Reviewed paper H+P, will be scanned into chart. No changes noted.  

## 2018-01-14 NOTE — Anesthesia Procedure Notes (Signed)
Procedure Name: LMA Insertion Date/Time: 01/14/2018 8:45 AM Performed by: Junious SilkNoles, Kylle Lall, CRNA Pre-anesthesia Checklist: Patient identified, Patient being monitored, Timeout performed, Emergency Drugs available and Suction available Patient Re-evaluated:Patient Re-evaluated prior to induction Oxygen Delivery Method: Circle system utilized Preoxygenation: Pre-oxygenation with 100% oxygen Induction Type: IV induction Ventilation: Mask ventilation without difficulty LMA: LMA inserted LMA Size: 4.0 Tube type: Oral Number of attempts: 1 Placement Confirmation: positive ETCO2 and breath sounds checked- equal and bilateral Tube secured with: Tape Dental Injury: Teeth and Oropharynx as per pre-operative assessment

## 2018-01-14 NOTE — Anesthesia Post-op Follow-up Note (Signed)
Anesthesia QCDR form completed.        

## 2018-01-14 NOTE — Discharge Instructions (Addendum)
Arm elevated is much as possible and work on gentle range of motion. Pain medicine as directed. Ice to the back of the hand today and tomorrow may help with pain.   AMBULATORY SURGERY  DISCHARGE INSTRUCTIONS   1) The drugs that you were given will stay in your system until tomorrow so for the next 24 hours you should not:  A) Drive an automobile B) Make any legal decisions C) Drink any alcoholic beverage   2) You may resume regular meals tomorrow.  Today it is better to start with liquids and gradually work up to solid foods.  You may eat anything you prefer, but it is better to start with liquids, then soup and crackers, and gradually work up to solid foods.   3) Please notify your doctor immediately if you have any unusual bleeding, trouble breathing, redness and pain at the surgery site, drainage, fever, or pain not relieved by medication.  Please contact your physician with any problems or Same Day Surgery at [phone], Monday through Friday 6 am to 4 pm, or [place] [facility] Main number at [phone].

## 2018-01-14 NOTE — Anesthesia Postprocedure Evaluation (Signed)
Anesthesia Post Note  Patient: Thomas Huff  Procedure(s) Performed: OPEN REDUCTION INTERNAL FIXATION (ORIF) METACARPAL (Left Hand)  Patient location during evaluation: PACU Anesthesia Type: General Level of consciousness: awake and alert Pain management: pain level controlled Vital Signs Assessment: post-procedure vital signs reviewed and stable Respiratory status: spontaneous breathing, nonlabored ventilation, respiratory function stable and patient connected to nasal cannula oxygen Cardiovascular status: blood pressure returned to baseline and stable Postop Assessment: no apparent nausea or vomiting Anesthetic complications: no     Last Vitals:  Vitals:   01/14/18 1040 01/14/18 1120  BP: 122/75 128/62  Pulse: 72 85  Resp: 16 18  Temp: (!) 36.4 C   SpO2: 100% 100%    Last Pain:  Vitals:   01/14/18 1120  TempSrc:   PainSc: 5                  [name] S

## 2018-01-14 NOTE — Anesthesia Preprocedure Evaluation (Signed)
Anesthesia Evaluation  Patient identified by MRN, date of birth, ID band Patient awake    Reviewed: Allergy & Precautions, NPO status , Patient's Chart, lab work & pertinent test results, reviewed documented beta blocker date and time   Airway Mallampati: II  TM Distance: >3 FB     Dental  (+) Chipped   Pulmonary asthma ,           Cardiovascular      Neuro/Psych    GI/Hepatic   Endo/Other    Renal/GU      Musculoskeletal   Abdominal   Peds  Hematology   Anesthesia Other Findings   Reproductive/Obstetrics                             Anesthesia Physical Anesthesia Plan  ASA: II  Anesthesia Plan: General   Post-op Pain Management:    Induction: Intravenous  PONV Risk Score and Plan:   Airway Management Planned: LMA  Additional Equipment:   Intra-op Plan:   Post-operative Plan:   Informed Consent: I have reviewed the patients History and Physical, chart, labs and discussed the procedure including the risks, benefits and alternatives for the proposed anesthesia with the patient or authorized representative who has indicated his/her understanding and acceptance.     Plan Discussed with: CRNA  Anesthesia Plan Comments:         Anesthesia Quick Evaluation

## 2018-01-14 NOTE — Op Note (Signed)
01/14/2018  9:54 AM  PATIENT:  Thomas Huff  22 y.o. male  PRE-OPERATIVE DIAGNOSIS:  closed nondisplaced fracture shaft fifth metacarpal bone of left hand  POST-OPERATIVE DIAGNOSIS:  closed nondisplaced fracture of shaft fifth metacarpal bone of left hand  PROCEDURE:  Procedure(s) with comments: OPEN REDUCTION INTERNAL FIXATION (ORIF) METACARPAL (Left) - left 5th metacarpal   SURGEON: Leitha SchullerMichael J Khila Papp, MD  ASSISTANTS: None  ANESTHESIA:   general  EBL:  Total I/O In: 1200 [I.V.:1200] Out: 5 [Blood:5]  BLOOD ADMINISTERED:none  DRAINS: none   LOCAL MEDICATIONS USED:  MARCAINE     SPECIMEN:  No Specimen  DISPOSITION OF SPECIMEN:  N/A  COUNTS:  YES  TOURNIQUET:   Total Tourniquet Time Documented: Upper Arm (Left) - 43 minutes Total: Upper Arm (Left) - 43 minutes   IMPLANTS: Biomet 1.5 mm plate with 2 locking and 2 nonlocking screws  DICTATION: .Dragon Dictation patient brought the operating room and after adequate anesthesia was obtained the left arm was prepped draped in sterile fashion. After patient identification and timeout procedure were completed, tourniquet was raised. Incision was made between the fourth and fifth metacarpals and the veins protected with the extensor tendon retracted ulnarly the fifth metacarpal was exposed. The fracture was reduced by applying pressure to the volar aspect of the metacarpal head with the finger flexed and anatomic alignment could be obtained. A plate was then contoured to fit this with a 1.5 mm plate 5 hole applied and contoured with 2 locking screws placed distally in the distal metacarpal and 2 proximal nonlocking screws holding the fracture well aligned. The center screw hole was directly over the fracture site was left empty. There was full passive range of motion and no catching there did not appear to be impingement of the hardware on the flexor or extensor tendon. The wound was thoroughly irrigated juncturae distally repaired and  the 3-0 Vicryl used subcutaneously followed by 4-0 nylon for the skin 10 cc of half percent Sensorcaine was infiltrated around the incision to aid in postop analgesia. Dressings of Xeroform 4 x 4 web roll volar splint and Ace wrap were then applied  PLAN OF CARE: Discharge to home after PACU  PATIENT DISPOSITION:  PACU - hemodynamically stable.

## 2018-01-14 NOTE — Transfer of Care (Signed)
Immediate Anesthesia Transfer of Care Note  Patient: Thomas Huff  Procedure(s) Performed: OPEN REDUCTION INTERNAL FIXATION (ORIF) METACARPAL (Left Hand)  Patient Location: PACU  Anesthesia Type:General  Level of Consciousness: awake and sedated  Airway & Oxygen Therapy: Patient Spontanous Breathing and Patient connected to face mask oxygen  Post-op Assessment: Report given to RN and Post -op Vital signs reviewed and stable  Post vital signs: Reviewed and stable  Last Vitals:  Vitals:   01/14/18 0737  BP: 131/72  Pulse: 69  Resp: 16  Temp: (!) 36.2 C  SpO2: 100%    Last Pain:  Vitals:   01/14/18 0737  TempSrc: Temporal         Complications: No apparent anesthesia complications

## 2018-01-14 NOTE — Anesthesia Procedure Notes (Signed)
Procedure Name: Intubation Date/Time: 01/14/2018 8:46 AM Performed by: Nelda Marseille, CRNA Pre-anesthesia Checklist: Patient identified, Patient being monitored, Timeout performed, Emergency Drugs available and Suction available Patient Re-evaluated:Patient Re-evaluated prior to induction Oxygen Delivery Method: Circle system utilized Preoxygenation: Pre-oxygenation with 100% oxygen Induction Type: IV induction Ventilation: Mask ventilation without difficulty Laryngoscope Size: Mac, 3 and McGraph Grade View: Grade II Tube type: Oral Tube size: 7.5 mm Number of attempts: 1 Airway Equipment and Method: Stylet Placement Confirmation: ETT inserted through vocal cords under direct vision,  positive ETCO2 and breath sounds checked- equal and bilateral Secured at: 21 cm Tube secured with: Tape Dental Injury: Teeth and Oropharynx as per pre-operative assessment

## 2018-03-15 ENCOUNTER — Other Ambulatory Visit: Payer: Self-pay

## 2018-03-15 ENCOUNTER — Encounter
Admission: RE | Admit: 2018-03-15 | Discharge: 2018-03-15 | Disposition: A | Discharge status: Home / Self Care | Payer: BLUE CROSS/BLUE SHIELD | Source: Ambulatory Visit | Attending: Orthopedic Surgery | Admitting: Orthopedic Surgery

## 2018-03-15 NOTE — Patient Instructions (Signed)
Your procedure is scheduled on: 03-18-18 Report to Same Day Surgery 2nd floor medical mall [facility](Medical Mall Entrance-take elevator on left to 2nd floor.  Check in with surgery information desk.) To find out your arrival time please call [phone] between 1PM - 3PM on 03-17-18  Remember: Instructions that are not followed completely may result in serious medical risk, up to and including death, or upon the discretion of your surgeon and anesthesiologist your surgery may need to be rescheduled.    _x___ 1. Do not eat food after midnight the night before your procedure. NO GUM OR CANDY AFTER MIDNIGHT.  You may drink clear liquids up to 2 hours before you are scheduled to arrive at the hospital for your procedure.  Do not drink clear liquids within 2 hours of your scheduled arrival to the hospital.  Clear liquids include  --Water or Apple juice without pulp  --Clear carbohydrate beverage such as ClearFast or Gatorade  --Black Coffee or Clear Tea (No milk, no creamers, do not add anything to  the coffee or Tea     __x__ 2. No Alcohol for 24 hours before or after surgery.   __x__3. No Smoking or e-cigarettes for 24 prior to surgery.  Do not use any chewable tobacco products for at least 6 hour prior to surgery   ____  4. Bring all medications with you on the day of surgery if instructed.    __x__ 5. Notify your doctor if there is any change in your medical condition     (cold, fever, infections).    x___6. On the morning of surgery brush your teeth with toothpaste and water.  You may rinse your mouth with mouth wash if you wish.  Do not swallow any toothpaste or mouthwash.   Do not wear jewelry, make-up, hairpins, clips or nail polish.  Do not wear lotions, powders, or perfumes. You may wear deodorant.  Do not shave 48 hours prior to surgery. Men may shave face and neck.  Do not bring valuables to the hospital.    [facility] for any belongings or valuables.     Contacts, dentures or bridgework may not be worn into surgery.  Leave your suitcase in the car. After surgery it may be brought to your room.  For patients admitted to the hospital, discharge time is determined by your treatment team.  _  Patients discharged the day of surgery will not be allowed to drive home.  You will need someone to drive you home and stay with you the night of your procedure.     ____ Take anti-hypertensive listed below, cardiac, seizure, asthma, anti-reflux and psychiatric medicines. These include:  1. NONE  2.  3.  4.  5.  6.  ____Fleets enema or Magnesium Citrate as directed.   ____ Use CHG Soap or sage wipes as directed on instruction sheet   _X___ Use inhalers on the day of surgery and bring to hospital day of surgery-USE YOUR ALBUTEROL INHALER AT HOME AND BRING TO HOSPITAL  ____ Stop Metformin and Janumet 2 days prior to surgery.    ____ Take 1/2 of usual insulin dose the night before surgery and none on the morning surgery.   ____ Follow recommendations from Cardiologist, Pulmonologist or PCP regarding stopping Aspirin, Coumadin, Plavix ,Eliquis, Effient, or Pradaxa, and Pletal.  X____Stop Anti-inflammatories such as Advil, Aleve, Ibuprofen, Motrin, Naproxen, Naprosyn, Goodies powders or aspirin products NOW- OK to take Tylenol    ____ Stop supplements until  after surgery.     ____ Bring C-Pap to the hospital.

## 2018-03-17 MED ORDER — CLINDAMYCIN PHOSPHATE 900 MG/50ML IV SOLN: 900.0000 mg | Freq: Once | INTRAVENOUS | Status: DC

## 2018-03-18 ENCOUNTER — Other Ambulatory Visit: Payer: Self-pay

## 2018-03-18 ENCOUNTER — Ambulatory Visit: Payer: Worker's Compensation | Admitting: Certified Registered Nurse Anesthetist

## 2018-03-18 ENCOUNTER — Ambulatory Visit
Admission: RE | Admit: 2018-03-18 | Discharge: 2018-03-18 | Disposition: A | Discharge status: Home / Self Care | Payer: Worker's Compensation | Source: Ambulatory Visit | Attending: Orthopedic Surgery | Admitting: Orthopedic Surgery

## 2018-03-18 ENCOUNTER — Encounter: Payer: Self-pay | Admitting: *Deleted

## 2018-03-18 ENCOUNTER — Encounter
Admission: RE | Disposition: A | Discharge status: Home / Self Care | Payer: Self-pay | Source: Ambulatory Visit | Attending: Orthopedic Surgery

## 2018-03-18 DIAGNOSIS — Y929 Unspecified place or not applicable: Secondary | ICD-10-CM | POA: Insufficient documentation

## 2018-03-18 DIAGNOSIS — T8484XA Pain due to internal orthopedic prosthetic devices, implants and grafts, initial encounter: Secondary | ICD-10-CM | POA: Diagnosis not present

## 2018-03-18 DIAGNOSIS — Y798 Miscellaneous orthopedic devices associated with adverse incidents, not elsewhere classified: Secondary | ICD-10-CM | POA: Insufficient documentation

## 2018-03-18 HISTORY — PX: HARDWARE REMOVAL: SHX979

## 2018-03-18 SURGERY — REMOVAL, HARDWARE
Anesthesia: Choice | Site: Hand | Laterality: Left | Wound class: "Clean "

## 2018-03-18 MED ORDER — LIDOCAINE HCL (PF) 2 % IJ SOLN
INTRAMUSCULAR | Status: AC
  Filled 2018-03-18: qty 10

## 2018-03-18 MED ORDER — FENTANYL CITRATE (PF) 100 MCG/2ML IJ SOLN
INTRAMUSCULAR | Status: AC
Start: 2018-03-18 — End: 2018-03-18
  Filled 2018-03-18: qty 2

## 2018-03-18 MED ORDER — HYDROMORPHONE HCL 1 MG/ML IJ SOLN
INTRAMUSCULAR | Status: AC
  Filled 2018-03-18: qty 1

## 2018-03-18 MED ORDER — KETOROLAC TROMETHAMINE 30 MG/ML IJ SOLN
INTRAMUSCULAR | Status: DC | PRN
  Administered 2018-03-18: 30 mg via INTRAVENOUS

## 2018-03-18 MED ORDER — FENTANYL CITRATE (PF) 100 MCG/2ML IJ SOLN
25.0000 ug | INTRAMUSCULAR | Status: AC | PRN
  Administered 2018-03-18 (×6): 25 ug via INTRAVENOUS

## 2018-03-18 MED ORDER — ONDANSETRON HCL 4 MG/2ML IJ SOLN
INTRAMUSCULAR | Status: DC | PRN
  Administered 2018-03-18: 4 mg via INTRAVENOUS

## 2018-03-18 MED ORDER — FENTANYL CITRATE (PF) 100 MCG/2ML IJ SOLN
INTRAMUSCULAR | Status: DC | PRN
  Administered 2018-03-18 (×2): 50 ug via INTRAVENOUS

## 2018-03-18 MED ORDER — HYDROCODONE-ACETAMINOPHEN 5-325 MG PO TABS
1.0000 | ORAL_TABLET | Freq: Four times a day (QID) | ORAL | Status: DC | PRN
  Administered 2018-03-18: 1 via ORAL

## 2018-03-18 MED ORDER — FAMOTIDINE 20 MG PO TABS
20.0000 mg | ORAL_TABLET | Freq: Once | ORAL | Status: AC
  Administered 2018-03-18: 20 mg via ORAL

## 2018-03-18 MED ORDER — BUPIVACAINE HCL (PF) 0.5 % IJ SOLN
INTRAMUSCULAR | Status: AC
  Filled 2018-03-18: qty 30

## 2018-03-18 MED ORDER — NEOMYCIN-POLYMYXIN B GU 40-200000 IR SOLN
Status: DC | PRN
  Administered 2018-03-18: 4 mL

## 2018-03-18 MED ORDER — ONDANSETRON HCL 4 MG/2ML IJ SOLN: 4.0000 mg | Freq: Once | INTRAMUSCULAR | Status: DC | PRN

## 2018-03-18 MED ORDER — HYDROCODONE-ACETAMINOPHEN 5-325 MG PO TABS: 1.0000 | ORAL_TABLET | Freq: Four times a day (QID) | ORAL | 0 refills | Status: DC | PRN

## 2018-03-18 MED ORDER — HYDROMORPHONE HCL 1 MG/ML IJ SOLN
0.5000 mg | INTRAMUSCULAR | Status: DC | PRN
  Administered 2018-03-18 (×2): 0.5 mg via INTRAVENOUS

## 2018-03-18 MED ORDER — HYDROCODONE-ACETAMINOPHEN 5-325 MG PO TABS
ORAL_TABLET | ORAL | Status: AC
  Filled 2018-03-18: qty 1

## 2018-03-18 MED ORDER — MIDAZOLAM HCL 2 MG/2ML IJ SOLN
INTRAMUSCULAR | Status: DC | PRN
  Administered 2018-03-18: 2 mg via INTRAVENOUS

## 2018-03-18 MED ORDER — PROPOFOL 10 MG/ML IV BOLUS
INTRAVENOUS | Status: AC
  Filled 2018-03-18: qty 20

## 2018-03-18 MED ORDER — NEOMYCIN-POLYMYXIN B GU 40-200000 IR SOLN
Status: AC
  Filled 2018-03-18: qty 4

## 2018-03-18 MED ORDER — MIDAZOLAM HCL 2 MG/2ML IJ SOLN
INTRAMUSCULAR | Status: AC
  Filled 2018-03-18: qty 2

## 2018-03-18 MED ORDER — FENTANYL CITRATE (PF) 100 MCG/2ML IJ SOLN
INTRAMUSCULAR | Status: AC
  Administered 2018-03-18: 25 ug via INTRAVENOUS
  Filled 2018-03-18: qty 2

## 2018-03-18 MED ORDER — FAMOTIDINE 20 MG PO TABS
ORAL_TABLET | ORAL | Status: AC
  Filled 2018-03-18: qty 1

## 2018-03-18 MED ORDER — DEXAMETHASONE SODIUM PHOSPHATE 10 MG/ML IJ SOLN
INTRAMUSCULAR | Status: DC | PRN
  Administered 2018-03-18: 10 mg via INTRAVENOUS

## 2018-03-18 MED ORDER — BUPIVACAINE HCL (PF) 0.5 % IJ SOLN
INTRAMUSCULAR | Status: DC | PRN
  Administered 2018-03-18: 10 mL

## 2018-03-18 MED ORDER — LIDOCAINE HCL (CARDIAC) PF 100 MG/5ML IV SOSY
PREFILLED_SYRINGE | INTRAVENOUS | Status: DC | PRN
  Administered 2018-03-18: 80 mg via INTRAVENOUS

## 2018-03-18 MED ORDER — CLINDAMYCIN PHOSPHATE 900 MG/50ML IV SOLN
INTRAVENOUS | Status: AC
  Filled 2018-03-18: qty 50

## 2018-03-18 MED ORDER — PROPOFOL 10 MG/ML IV BOLUS
INTRAVENOUS | Status: DC | PRN
  Administered 2018-03-18: 200 mg via INTRAVENOUS

## 2018-03-18 MED ORDER — HYDROMORPHONE HCL 1 MG/ML IJ SOLN
INTRAMUSCULAR | Status: AC
  Administered 2018-03-18: 0.5 mg via INTRAVENOUS
  Filled 2018-03-18: qty 1

## 2018-03-18 MED ORDER — LACTATED RINGERS IV SOLN
INTRAVENOUS | Status: DC
  Administered 2018-03-18: 11:00:00 via INTRAVENOUS

## 2018-03-18 SURGICAL SUPPLY — 34 items
CANISTER SUCT 1200ML W/VALVE (MISCELLANEOUS) ×3 IMPLANT
CHLORAPREP W/TINT 26ML (MISCELLANEOUS) ×3 IMPLANT
CUFF TOURN 24 STER (MISCELLANEOUS) IMPLANT
DRAPE C-ARM XRAY 36X54 (DRAPES) ×3 IMPLANT
DRAPE INCISE IOBAN 66X45 STRL (DRAPES) ×3 IMPLANT
DRSG EMULSION OIL 3X8 NADH (GAUZE/BANDAGES/DRESSINGS) ×3 IMPLANT
ELECT CAUTERY BLADE 6.4 (BLADE) ×3 IMPLANT
ELECT REM PT RETURN 9FT ADLT (ELECTROSURGICAL) ×3
ELECTRODE REM PT RTRN 9FT ADLT (ELECTROSURGICAL) ×1 IMPLANT
GAUZE PETRO XEROFOAM 1X8 (MISCELLANEOUS) ×3 IMPLANT
GAUZE SPONGE 4X4 12PLY STRL (GAUZE/BANDAGES/DRESSINGS) ×3 IMPLANT
GLOVE SURG SYN 9.0  PF PI (GLOVE) ×2
GLOVE SURG SYN 9.0 PF PI (GLOVE) ×1 IMPLANT
GOWN SRG 2XL LVL 4 RGLN SLV (GOWNS) ×1 IMPLANT
GOWN STRL NON-REIN 2XL LVL4 (GOWNS) ×2
GOWN STRL REUS W/ TWL LRG LVL3 (GOWN DISPOSABLE) ×1 IMPLANT
GOWN STRL REUS W/TWL LRG LVL3 (GOWN DISPOSABLE) ×2
KIT TURNOVER KIT A (KITS) ×3 IMPLANT
NDL FILTER BLUNT 18X1 1/2 (NEEDLE) ×1 IMPLANT
NEEDLE FILTER BLUNT 18X 1/2SAF (NEEDLE) ×2
NEEDLE FILTER BLUNT 18X1 1/2 (NEEDLE) ×1 IMPLANT
NS IRRIG 1000ML POUR BTL (IV SOLUTION) ×3 IMPLANT
PACK EXTREMITY ARMC (MISCELLANEOUS) ×3 IMPLANT
PAD ABD DERMACEA PRESS 5X9 (GAUZE/BANDAGES/DRESSINGS) ×6 IMPLANT
STAPLER SKIN PROX 35W (STAPLE) ×3 IMPLANT
SUT ETHIBOND NAB CT1 #1 30IN (SUTURE) ×3 IMPLANT
SUT ETHILON 3-0 FS-10 30 BLK (SUTURE) ×3
SUT MNCRL AB 4-0 PS2 18 (SUTURE) ×2 IMPLANT
SUT VIC AB 0 CT1 36 (SUTURE) ×3 IMPLANT
SUT VIC AB 2-0 CT1 27 (SUTURE) ×2
SUT VIC AB 2-0 CT1 TAPERPNT 27 (SUTURE) ×1 IMPLANT
SUTURE EHLN 3-0 FS-10 30 BLK (SUTURE) ×1 IMPLANT
SYR 10ML LL (SYRINGE) ×3 IMPLANT
WATER STERILE IRR 1000ML POUR (IV SOLUTION) ×3 IMPLANT

## 2018-03-18 NOTE — Anesthesia Post-op Follow-up Note (Signed)
Anesthesia QCDR form completed.        

## 2018-03-18 NOTE — Anesthesia Postprocedure Evaluation (Signed)
Anesthesia Post Note  Patient: Thomas Huff  Procedure(s) Performed: HARDWARE REMOVAL LEFT 5TH METACARPAL (Left Hand)  Patient location during evaluation: PACU Anesthesia Type: General Level of consciousness: awake and alert Pain management: pain level controlled Vital Signs Assessment: post-procedure vital signs reviewed and stable Respiratory status: spontaneous breathing, nonlabored ventilation, respiratory function stable and patient connected to nasal cannula oxygen Cardiovascular status: blood pressure returned to baseline and stable Postop Assessment: no apparent nausea or vomiting Anesthetic complications: no     Last Vitals:  Vitals:   03/18/18 1338 03/18/18 1342  BP: 119/77   Pulse: 82 70  Resp: 15 13  Temp:    SpO2: 96% 98%    Last Pain:  Vitals:   03/18/18 1342  TempSrc:   PainSc: 5                  Yevette EdwardsJames G Rotunda Worden

## 2018-03-18 NOTE — Op Note (Signed)
03/18/2018  1:08 PM  PATIENT:  Thomas Huff R Kraemer  22 y.o. male  PRE-OPERATIVE DIAGNOSIS: Painful hardware FIFTH METACARPAL LEFT HAND  POST-OPERATIVE DIAGNOSIS: Painful hardware FIFTH METACARPAL LEFT HAND  PROCEDURE:  Procedure(s): HARDWARE REMOVAL LEFT 5TH METACARPAL (Left)  SURGEON: Leitha SchullerMichael J Quantavis Obryant, MD  ASSISTANTS: None  ANESTHESIA:   general  EBL:  No intake/output data recorded.  BLOOD ADMINISTERED:none  DRAINS: none   LOCAL MEDICATIONS USED:  MARCAINE     SPECIMEN:  No Specimen  DISPOSITION OF SPECIMEN:  N/A  COUNTS:  YES  TOURNIQUET:  * No tourniquets in log *20 minutes at 250 mmHg  IMPLANTS: None  DICTATION: .Dragon Dictation patient brought the operating room and after adequate general anesthesia was obtained the left arm was prepped and draped in sterile fashion.  After patient verification timeout procedure tourniquet was raised and the prior scar was elliptically excised.  Subcutaneous tissue was spread and the extensor tendons were retracted with granulation tissue overlying the plate this was debrided and the plate exposed.  The 4 screws were removed without difficulty followed by plate removal with the fracture being healed the wound was irrigated and 10 cc of half percent Sensorcaine infiltrated for postop analgesia.  Skin was then closed with a 4-0 Monocryl subcuticular 4-0 nylon for the skin followed by Xeroform 4 x 4 web roll and Ace wrap tourniquet was down to close the case where was discarded  PLAN OF CARE: Discharge to home after PACU  PATIENT DISPOSITION:  PACU - hemodynamically stable.

## 2018-03-18 NOTE — Discharge Instructions (Signed)

## 2018-03-18 NOTE — Transfer of Care (Signed)
Immediate Anesthesia Transfer of Care Note  Patient: Thomas Huff  Procedure(s) Performed: HARDWARE REMOVAL LEFT 5TH METACARPAL (Left Hand)  Patient Location: PACU  Anesthesia Type:General  Level of Consciousness: awake  Airway & Oxygen Therapy: Patient Spontanous Breathing  Post-op Assessment: Report given to RN  Post vital signs: stable  Last Vitals:  Vitals Value Taken Time  BP 109/56 03/18/2018  1:08 PM  Temp    Pulse 87 03/18/2018  1:10 PM  Resp 16 03/18/2018  1:10 PM  SpO2 100 % 03/18/2018  1:10 PM  Vitals shown include unvalidated device data.  Last Pain:  Vitals:   03/18/18 1308  TempSrc:   PainSc: (P) Asleep         Complications: No apparent anesthesia complications

## 2018-03-18 NOTE — Anesthesia Preprocedure Evaluation (Signed)
Anesthesia Evaluation  Patient identified by MRN, date of birth, ID band Patient awake    Reviewed: Allergy & Precautions, H&P , NPO status , Patient's Chart, lab work & pertinent test results, reviewed documented beta blocker date and time   Airway Mallampati: II   Neck ROM: full    Dental  (+) Poor Dentition   Pulmonary neg pulmonary ROS,    Pulmonary exam normal        Cardiovascular negative cardio ROS Normal cardiovascular exam Rhythm:regular Rate:Normal     Neuro/Psych negative neurological ROS  negative psych ROS   GI/Hepatic negative GI ROS, Neg liver ROS,   Endo/Other  negative endocrine ROS  Renal/GU negative Renal ROS  negative genitourinary   Musculoskeletal   Abdominal   Peds  Hematology negative hematology ROS (+)   Anesthesia Other Findings Past Medical History: No date: Asthma     Comment:  WELL CONTROLLED Past Surgical History: 01/14/2018: OPEN REDUCTION INTERNAL FIXATION (ORIF) METACARPAL; Left     Comment:  Procedure: OPEN REDUCTION INTERNAL FIXATION (ORIF)               METACARPAL;  Surgeon: Kennedy BuckerMenz, Michael, MD;  Location: [facility]               ORS;  Service: Orthopedics;  Laterality: Left;  left 5th               metacarpal  No date: WISDOM TOOTH EXTRACTION BMI    Body Mass Index:  23.33 kg/m     Reproductive/Obstetrics negative OB ROS                             Anesthesia Physical Anesthesia Plan  ASA: III  Anesthesia Plan: General   Post-op Pain Management:    Induction:   PONV Risk Score and Plan:   Airway Management Planned:   Additional Equipment:   Intra-op Plan:   Post-operative Plan:   Informed Consent: I have reviewed the patients History and Physical, chart, labs and discussed the procedure including the risks, benefits and alternatives for the proposed anesthesia with the patient or authorized representative who has indicated his/her  understanding and acceptance.   Dental Advisory Given  Plan Discussed with: CRNA  Anesthesia Plan Comments:         Anesthesia Quick Evaluation

## 2018-03-18 NOTE — H&P (Signed)
Reviewed paper H+P, will be scanned into chart. No changes noted.  

## 2018-03-19 ENCOUNTER — Encounter: Payer: Self-pay | Admitting: Orthopedic Surgery

## 2020-01-03 ENCOUNTER — Encounter: Payer: Self-pay | Admitting: Urology

## 2020-01-03 ENCOUNTER — Other Ambulatory Visit: Payer: Self-pay

## 2020-01-03 ENCOUNTER — Ambulatory Visit: Payer: BC Managed Care – PPO | Admitting: Urology

## 2020-01-03 VITALS — BP 148/87 | HR 100 | Ht 72.0 in | Wt 203.0 lb

## 2020-01-03 DIAGNOSIS — N529 Male erectile dysfunction, unspecified: Secondary | ICD-10-CM

## 2020-01-03 MED ORDER — TADALAFIL 5 MG PO TABS: 5.0000 mg | ORAL_TABLET | Freq: Every day | ORAL | 11 refills | Status: DC | PRN

## 2020-01-03 NOTE — Progress Notes (Signed)
   01/03/20 3:55 PM   AMAAR OSHITA [birth year] [id number]  Referring provider: Marina Goodell, MD [address] [place],  [place] [address]  CC: Erectile dysfunction  HPI: I saw Mr. Handshoe today in urology clinic for erectile dysfunction.  He is a healthy 24 year old male that is noted intermittent difficulty with erections over the last few years.  He has never tried any medications for this.  He denies any gross hematuria or urinary symptoms.  He denies any chest pain or shortness of breath.   PMH: Past Medical History:  Diagnosis Date  . Asthma    WELL CONTROLLED    Surgical History: Past Surgical History:  Procedure Laterality Date  . HARDWARE REMOVAL Left 03/18/2018   Procedure: HARDWARE REMOVAL LEFT 5TH METACARPAL;  Surgeon: Kennedy Bucker, MD;  Location: [facility] ORS;  Service: Orthopedics;  Laterality: Left;  . OPEN REDUCTION INTERNAL FIXATION (ORIF) METACARPAL Left 01/14/2018   Procedure: OPEN REDUCTION INTERNAL FIXATION (ORIF) METACARPAL;  Surgeon: Kennedy Bucker, MD;  Location: [facility] OR[facility];  Service: Orthopedics;  Laterality: Left;  left 5th metacarpal   . WISDOM TOOTH EXTRACTION      Allergies:  Allergies  Allergen Reactions  . Penicillins Anaphylaxis, Hives, Swelling and Other (See Comments)    Has patient had a PCN reaction causing immediate rash, facial/tongue/throat swelling, SOB or lightheadedness with hypotension: Yes Has patient had a PCN reaction causing severe rash involving mucus membranes or skin necrosis: No Has patient had a PCN reaction that required hospitalization: Yes Has patient had a PCN reaction occurring within the last 10 years: Unknown If all of the above answers are "NO", then may proceed with Cephalosporin use.   Marland Kitchen Hydrocodone Nausea And Vomiting    Family History: Family History  Problem Relation Age of Onset  . Asthma Father   . Prostate cancer Neg Hx     Social History:  reports that he has never smoked. He has never used smokeless  tobacco. He reports current alcohol use. He reports that he does not use drugs.  ROS: Please see flowsheet from today's date for complete review of systems.  Physical Exam: BP (!) 148/87 (BP Location: Left Arm, Patient Position: Sitting, Cuff Size: Normal)   Pulse 100   Ht 6' (1.829 m)   Wt 203 lb (92.1 kg)   BMI 27.53 kg/m    Constitutional:  Alert and oriented, No acute distress. Cardiovascular: No clubbing, cyanosis, or edema. Respiratory: Normal respiratory effort, no increased work of breathing. GI: Abdomen is soft, nontender, nondistended, no abdominal masses Lymph: No cervical or inguinal lymphadenopathy. Skin: No rashes, bruises or suspicious lesions. Neurologic: Grossly intact, no focal deficits, moving all 4 extremities. Psychiatric: Normal mood and affect.   Assessment & Plan:   In summary, he is a healthy 24 year old male with intermittent erectile dysfunction over the last few years.  We discussed treatment options including first-line therapy with PDE 5 inhibitors.  We discussed the differences between Viagra and Cialis in terms of side effect profile and half-life.  Trial of Cialis 5 mg every other day or as needed  A total of 30 minutes were spent face-to-face with the patient, greater than 50% was spent in patient education, counseling, and coordination of care regarding ED and treatment options.   Sondra Come, MD  [facility] [address], [address] [place], [place] [address] [phone]

## 2020-01-03 NOTE — Patient Instructions (Signed)
Tadalafil tablets (Cialis) What is this medicine? TADALAFIL (tah DA la fil) is used to treat erection problems in men. It is also used for enlargement of the prostate gland in men, a condition called benign prostatic hyperplasia or BPH. This medicine improves urine flow and reduces BPH symptoms. This medicine can also treat both erection problems and BPH when they occur together. This medicine may be used for other purposes; ask your health care provider or pharmacist if you have questions. COMMON BRAND NAME(S): Adcirca, ALYQ, Cialis What should I tell my health care provider before I take this medicine? They need to know if you have any of these conditions:  bleeding disorders  eye or vision problems, including a rare inherited eye disease called retinitis pigmentosa  anatomical deformation of the penis, Peyronie's disease, or history of priapism (painful and prolonged erection)  heart disease, angina, a history of heart attack, irregular heart beats, or other heart problems  high or low blood pressure  history of blood diseases, like sickle cell anemia or leukemia  history of stomach bleeding  kidney disease  liver disease  stroke  an unusual or allergic reaction to tadalafil, other medicines, foods, dyes, or preservatives  pregnant or trying to get pregnant  breast-feeding How should I use this medicine? Take this medicine by mouth with a glass of water. Follow the directions on the prescription label. You may take this medicine with or without meals. When this medicine is used for erection problems, your doctor may prescribe it to be taken once daily or as needed. If you are taking the medicine as needed, you may be able to have sexual activity 30 minutes after taking it and for up to 36 hours after taking it. Whether you are taking the medicine as needed or once daily, you should not take more than one dose per day. If you are taking this medicine for symptoms of benign  prostatic hyperplasia (BPH) or to treat both BPH and an erection problem, take the dose once daily at about the same time each day. Do not take your medicine more often than directed. Talk to your pediatrician regarding the use of this medicine in children. Special care may be needed. Overdosage: If you think you have taken too much of this medicine contact a poison control center or emergency room at once. NOTE: This medicine is only for you. Do not share this medicine with others. What if I miss a dose? If you are taking this medicine as needed for erection problems, this does not apply. If you miss a dose while taking this medicine once daily for an erection problem, benign prostatic hyperplasia, or both, take it as soon as you remember, but do not take more than one dose per day. What may interact with this medicine? Do not take this medicine with any of the following medications:  nitrates like amyl nitrite, isosorbide dinitrate, isosorbide mononitrate, nitroglycerin  other medicines for erectile dysfunction like avanafil, sildenafil, vardenafil  other tadalafil products (Adcirca)  riociguat This medicine may also interact with the following medications:  certain drugs for high blood pressure  certain drugs for the treatment of HIV infection or AIDS  certain drugs used for fungal or yeast infections, like fluconazole, itraconazole, ketoconazole, and voriconazole  certain drugs used for seizures like carbamazepine, phenytoin, and phenobarbital  grapefruit juice  macrolide antibiotics like clarithromycin, erythromycin, troleandomycin  medicines for prostate problems  rifabutin, rifampin or rifapentine This list may not describe all possible interactions. Give your   health care provider a list of all the medicines, herbs, non-prescription drugs, or dietary supplements you use. Also tell them if you smoke, drink alcohol, or use illegal drugs. Some items may interact with your  medicine. What should I watch for while using this medicine? If you notice any changes in your vision while taking this drug, call your doctor or health care professional as soon as possible. Stop using this medicine and call your health care provider right away if you have a loss of sight in one or both eyes. Contact your doctor or health care professional right away if the erection lasts longer than 4 hours or if it becomes painful. This may be a sign of serious problem and must be treated right away to prevent permanent damage. If you experience symptoms of nausea, dizziness, chest pain or arm pain upon initiation of sexual activity after taking this medicine, you should refrain from further activity and call your doctor or health care professional as soon as possible. Do not drink alcohol to excess (examples, 5 glasses of wine or 5 shots of whiskey) when taking this medicine. When taken in excess, alcohol can increase your chances of getting a headache or getting dizzy, increasing your heart rate or lowering your blood pressure. Using this medicine does not protect you or your partner against HIV infection (the virus that causes AIDS) or other sexually transmitted diseases. What side effects may I notice from receiving this medicine? Side effects that you should report to your doctor or health care professional as soon as possible:  allergic reactions like skin rash, itching or hives, swelling of the face, lips, or tongue  breathing problems  changes in hearing  changes in vision  chest pain  fast, irregular heartbeat  prolonged or painful erection  seizures Side effects that usually do not require medical attention (report to your doctor or health care professional if they continue or are bothersome):  back pain  dizziness  flushing  headache  indigestion  muscle aches  nausea  stuffy or runny nose This list may not describe all possible side effects. Call your doctor  for medical advice about side effects. You may report side effects to FDA at 1-800-FDA-1088. Where should I keep my medicine? Keep out of the reach of children. Store at room temperature between 15 and 30 degrees C (59 and 86 degrees F). Throw away any unused medicine after the expiration date. NOTE: This sheet is a summary. It may not cover all possible information. If you have questions about this medicine, talk to your doctor, pharmacist, or health care provider.  2020 Elsevier/Gold Standard (2014-04-07 13:15:49)  

## 2020-02-17 ENCOUNTER — Ambulatory Visit: Payer: Self-pay | Attending: Internal Medicine

## 2020-02-17 DIAGNOSIS — Z23 Encounter for immunization: Secondary | ICD-10-CM

## 2020-02-17 NOTE — Progress Notes (Signed)
   Covid-19 Vaccination Clinic  Name:  [name]    MRN: [id number] DOB: October 13, 1996  02/17/2020  Mr. Liz was observed post Covid-19 immunization for 30 minutes based on pre-vaccination screening without incident. He was provided with Vaccine Information Sheet and instruction to access the V-Safe system.   Mr. Amrhein was instructed to call 911 with any severe reactions post vaccine: Marland Kitchen Difficulty breathing  . Swelling of face and throat  . A fast heartbeat  . A bad rash all over body  . Dizziness and weakness   Immunizations Administered    Name Date Dose VIS Date Route   Pfizer COVID-19 Vaccine 02/17/2020  2:44 PM 0.3 mL 11/11/2019 Intramuscular   Manufacturer: [employer], [employer]   Lot: [id number]   NDC: [address]-4

## 2020-03-13 ENCOUNTER — Ambulatory Visit: Payer: Self-pay | Attending: Internal Medicine

## 2020-03-13 DIAGNOSIS — Z23 Encounter for immunization: Secondary | ICD-10-CM

## 2020-03-13 NOTE — Progress Notes (Signed)
   Covid-19 Vaccination Clinic  Name:  TREVAUN RENDLEMAN    MRN: [id number] DOB: [birth year]  03/13/2020  Mr. Lavery was observed post Covid-19 immunization for 30 minutes based on pre-vaccination screening without incident. He was provided with Vaccine Information Sheet and instruction to access the V-Safe system.   Mr. [place] was instructed to call 911 with any severe reactions post vaccine: Marland Kitchen Difficulty breathing  . Swelling of face and throat  . A fast heartbeat  . A bad rash all over body  . Dizziness and weakness   Immunizations Administered    Name Date Dose VIS Date Route   Pfizer COVID-19 Vaccine 03/13/2020  3:09 PM 0.3 mL 11/11/2019 Intramuscular   Manufacturer: [employer], [employer]   Lot: [id number]   NDC: [address]-8

## 2021-01-27 ENCOUNTER — Other Ambulatory Visit: Payer: Self-pay

## 2021-01-27 DIAGNOSIS — Z20822 Contact with and (suspected) exposure to covid-19: Secondary | ICD-10-CM | POA: Insufficient documentation

## 2021-01-27 DIAGNOSIS — K3580 Unspecified acute appendicitis: Principal | ICD-10-CM | POA: Insufficient documentation

## 2021-01-27 DIAGNOSIS — J45909 Unspecified asthma, uncomplicated: Secondary | ICD-10-CM | POA: Diagnosis not present

## 2021-01-27 DIAGNOSIS — R1031 Right lower quadrant pain: Secondary | ICD-10-CM | POA: Diagnosis present

## 2021-01-27 LAB — CBC
HCT: 48 % (ref 39.0–52.0)
Hemoglobin: 16.7 g/dL (ref 13.0–17.0)
MCH: 32.6 pg (ref 26.0–34.0)
MCHC: 34.8 g/dL (ref 30.0–36.0)
MCV: 93.8 fL (ref 80.0–100.0)
Platelets: 217 10*3/uL (ref 150–400)
RBC: 5.12 MIL/uL (ref 4.22–5.81)
RDW: 11.4 % — ABNORMAL LOW (ref 11.5–15.5)
WBC: 11.4 10*3/uL — ABNORMAL HIGH (ref 4.0–10.5)
nRBC: 0 % (ref 0.0–0.2)

## 2021-01-27 NOTE — ED Triage Notes (Signed)
Pt states right sided abdominal pain that started at 5pm tonight. Pt states pain is to the RLQ and is severe. Pt states he still has his appendix.

## 2021-01-28 ENCOUNTER — Observation Stay: Payer: BC Managed Care – PPO | Admitting: Registered Nurse

## 2021-01-28 ENCOUNTER — Encounter
Admission: EM | Disposition: A | Discharge status: Home / Self Care | Payer: Self-pay | Source: Home / Self Care | Attending: Emergency Medicine

## 2021-01-28 ENCOUNTER — Observation Stay
Admission: EM | Admit: 2021-01-28 | Discharge: 2021-01-28 | Disposition: A | Discharge status: Home / Self Care | Payer: BC Managed Care – PPO | Attending: General Surgery | Admitting: General Surgery

## 2021-01-28 ENCOUNTER — Encounter: Payer: Self-pay | Admitting: General Surgery

## 2021-01-28 ENCOUNTER — Emergency Department: Payer: BC Managed Care – PPO

## 2021-01-28 DIAGNOSIS — D72829 Elevated white blood cell count, unspecified: Secondary | ICD-10-CM

## 2021-01-28 DIAGNOSIS — R1031 Right lower quadrant pain: Secondary | ICD-10-CM

## 2021-01-28 DIAGNOSIS — K353 Acute appendicitis with localized peritonitis, without perforation or gangrene: Secondary | ICD-10-CM

## 2021-01-28 DIAGNOSIS — R935 Abnormal findings on diagnostic imaging of other abdominal regions, including retroperitoneum: Secondary | ICD-10-CM | POA: Diagnosis not present

## 2021-01-28 DIAGNOSIS — K3589 Other acute appendicitis without perforation or gangrene: Secondary | ICD-10-CM | POA: Diagnosis not present

## 2021-01-28 DIAGNOSIS — K3580 Unspecified acute appendicitis: Secondary | ICD-10-CM | POA: Diagnosis present

## 2021-01-28 HISTORY — PX: LAPAROSCOPIC APPENDECTOMY: SHX408

## 2021-01-28 LAB — URINALYSIS, COMPLETE (UACMP) WITH MICROSCOPIC
Bacteria, UA: NONE SEEN
Bilirubin Urine: NEGATIVE
Glucose, UA: NEGATIVE mg/dL
Hgb urine dipstick: NEGATIVE
Ketones, ur: NEGATIVE mg/dL
Leukocytes,Ua: NEGATIVE
Nitrite: NEGATIVE
Protein, ur: NEGATIVE mg/dL
Specific Gravity, Urine: 1.021 (ref 1.005–1.030)
Squamous Epithelial / LPF: NONE SEEN (ref 0–5)
pH: 5 (ref 5.0–8.0)

## 2021-01-28 LAB — COMPREHENSIVE METABOLIC PANEL
ALT: 20 U/L (ref 0–44)
AST: 23 U/L (ref 15–41)
Albumin: 4.7 g/dL (ref 3.5–5.0)
Alkaline Phosphatase: 81 U/L (ref 38–126)
Anion gap: 10 (ref 5–15)
BUN: 13 mg/dL (ref 6–20)
CO2: 22 mmol/L (ref 22–32)
Calcium: 10 mg/dL (ref 8.9–10.3)
Chloride: 103 mmol/L (ref 98–111)
Creatinine, Ser: 0.99 mg/dL (ref 0.61–1.24)
GFR, Estimated: >60 mL/min (ref >60)
Glucose, Bld: 152 mg/dL — ABNORMAL HIGH (ref 70–99)
Potassium: 3.9 mmol/L (ref 3.5–5.1)
Sodium: 135 mmol/L (ref 135–145)
Total Bilirubin: 0.8 mg/dL (ref 0.3–1.2)
Total Protein: 8.6 g/dL — ABNORMAL HIGH (ref 6.5–8.1)

## 2021-01-28 LAB — RESP PANEL BY RT-PCR (FLU A&B, COVID) ARPGX2
Influenza A by PCR: NEGATIVE
Influenza B by PCR: NEGATIVE
SARS Coronavirus 2 by RT PCR: NEGATIVE

## 2021-01-28 LAB — LIPASE, BLOOD: Lipase: 36 U/L (ref 11–51)

## 2021-01-28 SURGERY — APPENDECTOMY, LAPAROSCOPIC
Anesthesia: General

## 2021-01-28 MED ORDER — ONDANSETRON HCL 4 MG/2ML IJ SOLN
4.0000 mg | INTRAMUSCULAR | Status: AC
  Administered 2021-01-28: 4 mg via INTRAVENOUS
  Filled 2021-01-28: qty 2

## 2021-01-28 MED ORDER — IBUPROFEN 800 MG PO TABS: 800.0000 mg | ORAL_TABLET | Freq: Three times a day (TID) | ORAL | 0 refills | Status: DC | PRN

## 2021-01-28 MED ORDER — ACETAMINOPHEN 500 MG PO TABS: 1000.0000 mg | ORAL_TABLET | Freq: Four times a day (QID) | ORAL | 0 refills | Status: DC

## 2021-01-28 MED ORDER — PROPOFOL 10 MG/ML IV BOLUS
INTRAVENOUS | Status: DC | PRN
  Administered 2021-01-28: 170 mg via INTRAVENOUS

## 2021-01-28 MED ORDER — CELECOXIB 200 MG PO CAPS: 200.0000 mg | ORAL_CAPSULE | ORAL | Status: AC

## 2021-01-28 MED ORDER — LIDOCAINE HCL (CARDIAC) PF 100 MG/5ML IV SOSY
PREFILLED_SYRINGE | INTRAVENOUS | Status: DC | PRN
  Administered 2021-01-28: 100 mg via INTRAVENOUS

## 2021-01-28 MED ORDER — ROCURONIUM BROMIDE 10 MG/ML (PF) SYRINGE
PREFILLED_SYRINGE | INTRAVENOUS | Status: AC
  Filled 2021-01-28: qty 10

## 2021-01-28 MED ORDER — FENTANYL CITRATE (PF) 100 MCG/2ML IJ SOLN
INTRAMUSCULAR | Status: AC
  Filled 2021-01-28: qty 2

## 2021-01-28 MED ORDER — FENTANYL CITRATE (PF) 100 MCG/2ML IJ SOLN
INTRAMUSCULAR | Status: AC
  Administered 2021-01-28: 25 ug via INTRAVENOUS
  Filled 2021-01-28: qty 2

## 2021-01-28 MED ORDER — ACETAMINOPHEN 500 MG PO TABS: 1000.0000 mg | ORAL_TABLET | ORAL | Status: AC

## 2021-01-28 MED ORDER — MIDAZOLAM HCL 2 MG/2ML IJ SOLN
INTRAMUSCULAR | Status: DC | PRN
  Administered 2021-01-28: 2 mg via INTRAVENOUS

## 2021-01-28 MED ORDER — ONDANSETRON 4 MG PO TBDP: 4.0000 mg | ORAL_TABLET | Freq: Three times a day (TID) | ORAL | 0 refills | Status: DC | PRN

## 2021-01-28 MED ORDER — GABAPENTIN 300 MG PO CAPS
ORAL_CAPSULE | ORAL | Status: AC
  Administered 2021-01-28: 300 mg via ORAL
  Filled 2021-01-28: qty 1

## 2021-01-28 MED ORDER — CELECOXIB 200 MG PO CAPS
ORAL_CAPSULE | ORAL | Status: AC
  Administered 2021-01-28: 200 mg via ORAL
  Filled 2021-01-28: qty 1

## 2021-01-28 MED ORDER — CIPROFLOXACIN IN D5W 400 MG/200ML IV SOLN
400.0000 mg | Freq: Two times a day (BID) | INTRAVENOUS | Status: DC
  Administered 2021-01-28: 400 mg via INTRAVENOUS
  Filled 2021-01-28: qty 200

## 2021-01-28 MED ORDER — METRONIDAZOLE IN NACL 5-0.79 MG/ML-% IV SOLN
500.0000 mg | Freq: Three times a day (TID) | INTRAVENOUS | Status: DC
  Administered 2021-01-28: 500 mg via INTRAVENOUS
  Filled 2021-01-28: qty 100

## 2021-01-28 MED ORDER — METRONIDAZOLE IN NACL 5-0.79 MG/ML-% IV SOLN: 500.0000 mg | Freq: Once | INTRAVENOUS | Status: DC

## 2021-01-28 MED ORDER — DEXMEDETOMIDINE (PRECEDEX) IN NS 20 MCG/5ML (4 MCG/ML) IV SYRINGE
PREFILLED_SYRINGE | INTRAVENOUS | Status: AC
  Filled 2021-01-28: qty 5

## 2021-01-28 MED ORDER — DEXTROSE IN LACTATED RINGERS 5 % IV SOLN: INTRAVENOUS | Status: DC

## 2021-01-28 MED ORDER — ACETAMINOPHEN 500 MG PO TABS: 1000.0000 mg | ORAL_TABLET | Freq: Four times a day (QID) | ORAL | Status: DC

## 2021-01-28 MED ORDER — GLYCOPYRROLATE 0.2 MG/ML IJ SOLN
INTRAMUSCULAR | Status: AC
  Filled 2021-01-28: qty 1

## 2021-01-28 MED ORDER — DEXAMETHASONE SODIUM PHOSPHATE 10 MG/ML IJ SOLN
INTRAMUSCULAR | Status: DC | PRN
  Administered 2021-01-28: 10 mg via INTRAVENOUS

## 2021-01-28 MED ORDER — BUPIVACAINE HCL (PF) 0.25 % IJ SOLN
INTRAMUSCULAR | Status: AC
  Filled 2021-01-28: qty 30

## 2021-01-28 MED ORDER — IOHEXOL 300 MG/ML  SOLN
100.0000 mL | Freq: Once | INTRAMUSCULAR | Status: AC | PRN
  Administered 2021-01-28: 100 mL via INTRAVENOUS

## 2021-01-28 MED ORDER — MIDAZOLAM HCL 2 MG/2ML IJ SOLN
INTRAMUSCULAR | Status: AC
  Filled 2021-01-28: qty 2

## 2021-01-28 MED ORDER — METRONIDAZOLE IN NACL 5-0.79 MG/ML-% IV SOLN
500.0000 mg | INTRAVENOUS | Status: DC
  Filled 2021-01-28: qty 100

## 2021-01-28 MED ORDER — LIDOCAINE HCL (PF) 2 % IJ SOLN
INTRAMUSCULAR | Status: AC
  Filled 2021-01-28: qty 5

## 2021-01-28 MED ORDER — ONDANSETRON HCL 4 MG/2ML IJ SOLN
INTRAMUSCULAR | Status: DC | PRN
  Administered 2021-01-28: 4 mg via INTRAVENOUS

## 2021-01-28 MED ORDER — LIDOCAINE-EPINEPHRINE 1 %-1:100000 IJ SOLN
INTRAMUSCULAR | Status: DC | PRN
  Administered 2021-01-28: 12 mL via SUBCUTANEOUS

## 2021-01-28 MED ORDER — DEXMEDETOMIDINE (PRECEDEX) IN NS 20 MCG/5ML (4 MCG/ML) IV SYRINGE
PREFILLED_SYRINGE | INTRAVENOUS | Status: DC | PRN
  Administered 2021-01-28: 8 ug via INTRAVENOUS
  Administered 2021-01-28: 12 ug via INTRAVENOUS

## 2021-01-28 MED ORDER — ONDANSETRON HCL 4 MG/2ML IJ SOLN: 4.0000 mg | Freq: Once | INTRAMUSCULAR | Status: DC | PRN

## 2021-01-28 MED ORDER — LACTATED RINGERS IV SOLN: INTRAVENOUS | Status: DC | PRN

## 2021-01-28 MED ORDER — ROCURONIUM BROMIDE 100 MG/10ML IV SOLN
INTRAVENOUS | Status: DC | PRN
  Administered 2021-01-28: 50 mg via INTRAVENOUS

## 2021-01-28 MED ORDER — KETOROLAC TROMETHAMINE 30 MG/ML IJ SOLN: 30.0000 mg | Freq: Four times a day (QID) | INTRAMUSCULAR | Status: DC | PRN

## 2021-01-28 MED ORDER — FENTANYL CITRATE (PF) 100 MCG/2ML IJ SOLN
INTRAMUSCULAR | Status: DC | PRN
  Administered 2021-01-28: 50 ug via INTRAVENOUS
  Administered 2021-01-28 (×2): 25 ug via INTRAVENOUS

## 2021-01-28 MED ORDER — FENTANYL CITRATE (PF) 100 MCG/2ML IJ SOLN
25.0000 ug | INTRAMUSCULAR | Status: DC | PRN
Start: 2021-01-28 — End: 2021-01-28
  Administered 2021-01-28 (×3): 25 ug via INTRAVENOUS

## 2021-01-28 MED ORDER — GABAPENTIN 300 MG PO CAPS: 300.0000 mg | ORAL_CAPSULE | ORAL | Status: AC

## 2021-01-28 MED ORDER — SEVOFLURANE IN SOLN
RESPIRATORY_TRACT | Status: AC
  Filled 2021-01-28: qty 250

## 2021-01-28 MED ORDER — ONDANSETRON 4 MG PO TBDP: 4.0000 mg | ORAL_TABLET | Freq: Four times a day (QID) | ORAL | Status: DC | PRN

## 2021-01-28 MED ORDER — HYDROMORPHONE HCL 1 MG/ML IJ SOLN: 0.5000 mg | INTRAMUSCULAR | Status: DC | PRN

## 2021-01-28 MED ORDER — LIDOCAINE-EPINEPHRINE 1 %-1:100000 IJ SOLN
INTRAMUSCULAR | Status: AC
  Filled 2021-01-28: qty 1

## 2021-01-28 MED ORDER — PROPOFOL 10 MG/ML IV BOLUS
INTRAVENOUS | Status: AC
  Filled 2021-01-28: qty 20

## 2021-01-28 MED ORDER — ONDANSETRON HCL 4 MG/2ML IJ SOLN
INTRAMUSCULAR | Status: AC
  Filled 2021-01-28: qty 2

## 2021-01-28 MED ORDER — PHENYLEPHRINE HCL (PRESSORS) 10 MG/ML IV SOLN
INTRAVENOUS | Status: AC
  Filled 2021-01-28: qty 1

## 2021-01-28 MED ORDER — TRAMADOL HCL 50 MG PO TABS: 50.0000 mg | ORAL_TABLET | Freq: Four times a day (QID) | ORAL | 0 refills | Status: DC | PRN

## 2021-01-28 MED ORDER — GLYCOPYRROLATE 0.2 MG/ML IJ SOLN
INTRAMUSCULAR | Status: DC | PRN
  Administered 2021-01-28: .2 mg via INTRAVENOUS

## 2021-01-28 MED ORDER — PHENYLEPHRINE HCL (PRESSORS) 10 MG/ML IV SOLN
INTRAVENOUS | Status: DC | PRN
  Administered 2021-01-28: 100 ug via INTRAVENOUS

## 2021-01-28 MED ORDER — CIPROFLOXACIN IN D5W 400 MG/200ML IV SOLN: 400.0000 mg | Freq: Once | INTRAVENOUS | Status: DC

## 2021-01-28 MED ORDER — MORPHINE SULFATE (PF) 4 MG/ML IV SOLN
4.0000 mg | Freq: Once | INTRAVENOUS | Status: AC
  Administered 2021-01-28: 4 mg via INTRAVENOUS
  Filled 2021-01-28: qty 1

## 2021-01-28 MED ORDER — ACETAMINOPHEN 500 MG PO TABS
ORAL_TABLET | ORAL | Status: AC
  Administered 2021-01-28: 1000 mg via ORAL
  Filled 2021-01-28: qty 2

## 2021-01-28 MED ORDER — DEXAMETHASONE SODIUM PHOSPHATE 10 MG/ML IJ SOLN
INTRAMUSCULAR | Status: AC
  Filled 2021-01-28: qty 1

## 2021-01-28 MED ORDER — CIPROFLOXACIN IN D5W 400 MG/200ML IV SOLN: 400.0000 mg | INTRAVENOUS | Status: DC

## 2021-01-28 MED ORDER — ONDANSETRON HCL 4 MG/2ML IJ SOLN: 4.0000 mg | Freq: Four times a day (QID) | INTRAMUSCULAR | Status: DC | PRN

## 2021-01-28 SURGICAL SUPPLY — 52 items
APPLICATOR COTTON TIP 6 STRL (MISCELLANEOUS) ×1 IMPLANT
APPLICATOR COTTON TIP 6IN STRL (MISCELLANEOUS) ×2
APPLIER CLIP 5 13 M/L LIGAMAX5 (MISCELLANEOUS)
BLADE CLIPPER SURG (BLADE) IMPLANT
BLADE SURG SZ11 CARB STEEL (BLADE) ×2 IMPLANT
CANISTER SUCT 1200ML W/VALVE (MISCELLANEOUS) ×2 IMPLANT
CHLORAPREP W/TINT 26 (MISCELLANEOUS) ×2 IMPLANT
CLIP APPLIE 5 13 M/L LIGAMAX5 (MISCELLANEOUS) IMPLANT
COVER WAND RF STERILE (DRAPES) ×2 IMPLANT
CUTTER FLEX LINEAR 45M (STAPLE) ×2 IMPLANT
DEFOGGER SCOPE WARMER CLEARIFY (MISCELLANEOUS) ×2 IMPLANT
DERMABOND ADVANCED (GAUZE/BANDAGES/DRESSINGS) ×1
DERMABOND ADVANCED .7 DNX12 (GAUZE/BANDAGES/DRESSINGS) ×1 IMPLANT
ELECT CAUTERY BLADE TIP 2.5 (TIP) ×2
ELECT REM PT RETURN 9FT ADLT (ELECTROSURGICAL) ×2
ELECTRODE CAUTERY BLDE TIP 2.5 (TIP) ×1 IMPLANT
ELECTRODE REM PT RTRN 9FT ADLT (ELECTROSURGICAL) ×1 IMPLANT
GLOVE SURG ENC MOIS LTX SZ6.5 (GLOVE) ×2 IMPLANT
GLOVE SURG UNDER LTX SZ7 (GLOVE) ×2 IMPLANT
GOWN STRL REUS W/ TWL LRG LVL3 (GOWN DISPOSABLE) ×2 IMPLANT
GOWN STRL REUS W/TWL LRG LVL3 (GOWN DISPOSABLE) ×2
GRASPER SUT TROCAR 14GX15 (MISCELLANEOUS) ×2 IMPLANT
IRRIGATION STRYKERFLOW (MISCELLANEOUS) ×1 IMPLANT
IRRIGATOR STRYKERFLOW (MISCELLANEOUS) ×2
IV NS 1000ML (IV SOLUTION) ×1
IV NS 1000ML BAXH (IV SOLUTION) ×1 IMPLANT
KIT TURNOVER KIT A (KITS) ×2 IMPLANT
KITTNER LAPARASCOPIC 5X40 (MISCELLANEOUS) ×2 IMPLANT
LABEL OR SOLS (LABEL) ×2 IMPLANT
MANIFOLD NEPTUNE II (INSTRUMENTS) ×2 IMPLANT
NEEDLE HYPO 22GX1.5 SAFETY (NEEDLE) ×2 IMPLANT
NS IRRIG 500ML POUR BTL (IV SOLUTION) ×2 IMPLANT
PACK LAP CHOLECYSTECTOMY (MISCELLANEOUS) ×2 IMPLANT
PENCIL ELECTRO HAND CTR (MISCELLANEOUS) ×2 IMPLANT
POUCH SPECIMEN RETRIEVAL 10MM (ENDOMECHANICALS) ×2 IMPLANT
RELOAD STAPLE TA45 3.5 REG BLU (ENDOMECHANICALS) ×2 IMPLANT
SCISSORS METZENBAUM CVD 33 (INSTRUMENTS) ×2 IMPLANT
SET TUBE SMOKE EVAC HIGH FLOW (TUBING) ×2 IMPLANT
SHEARS HARMONIC ACE PLUS 36CM (ENDOMECHANICALS) ×2 IMPLANT
SLEEVE ADV FIXATION 5X100MM (TROCAR) ×2 IMPLANT
STRIP CLOSURE SKIN 1/2X4 (GAUZE/BANDAGES/DRESSINGS) ×2 IMPLANT
SUT MNCRL 4-0 (SUTURE) ×1
SUT MNCRL 4-0 27XMFL (SUTURE) ×1
SUT VIC AB 3-0 SH 27 (SUTURE) ×1
SUT VIC AB 3-0 SH 27X BRD (SUTURE) ×1 IMPLANT
SUT VICRYL 0 AB UR-6 (SUTURE) ×2 IMPLANT
SUTURE MNCRL 4-0 27XMF (SUTURE) ×1 IMPLANT
SYS KII FIOS ACCESS ABD 5X100 (TROCAR) ×2
SYSTEM KII FIOS ACES ABD 5X100 (TROCAR) ×1 IMPLANT
TRAY FOLEY MTR SLVR 16FR STAT (SET/KITS/TRAYS/PACK) ×2 IMPLANT
TROCAR ADV FIXATION 12X100MM (TROCAR) IMPLANT
TROCAR BALLN GELPORT 12X130M (ENDOMECHANICALS) ×2 IMPLANT

## 2021-01-28 SURGICAL SUPPLY — 57 items
ADH SKN CLS APL DERMABOND .7 (GAUZE/BANDAGES/DRESSINGS) ×1
APL PRP STRL LF DISP 70% ISPRP (MISCELLANEOUS) ×1
APL SWBSTK 6 STRL LF DISP (MISCELLANEOUS) ×1
APPLICATOR COTTON TIP 6 STRL (MISCELLANEOUS) ×1 IMPLANT
APPLICATOR COTTON TIP 6IN STRL (MISCELLANEOUS) ×2
APPLIER CLIP 5 13 M/L LIGAMAX5 (MISCELLANEOUS)
APR CLP MED LRG 5 ANG JAW (MISCELLANEOUS)
BAG SPEC RTRVL LRG 6X4 10 (ENDOMECHANICALS) ×1
BLADE CLIPPER SURG (BLADE) ×2 IMPLANT
BLADE SURG SZ11 CARB STEEL (BLADE) ×2 IMPLANT
CANISTER SUCT 1200ML W/VALVE (MISCELLANEOUS) ×2 IMPLANT
CHLORAPREP W/TINT 26 (MISCELLANEOUS) ×2 IMPLANT
CLIP APPLIE 5 13 M/L LIGAMAX5 (MISCELLANEOUS) IMPLANT
COVER WAND RF STERILE (DRAPES) ×2 IMPLANT
CUTTER FLEX LINEAR 45M (STAPLE) ×2 IMPLANT
DEFOGGER SCOPE WARMER CLEARIFY (MISCELLANEOUS) ×2 IMPLANT
DERMABOND ADVANCED (GAUZE/BANDAGES/DRESSINGS) ×1
DERMABOND ADVANCED .7 DNX12 (GAUZE/BANDAGES/DRESSINGS) ×1 IMPLANT
ELECT CAUTERY BLADE TIP 2.5 (TIP) ×2
ELECT REM PT RETURN 9FT ADLT (ELECTROSURGICAL) ×2
ELECTRODE CAUTERY BLDE TIP 2.5 (TIP) ×1 IMPLANT
ELECTRODE REM PT RTRN 9FT ADLT (ELECTROSURGICAL) ×1 IMPLANT
GLOVE SURG ENC MOIS LTX SZ6.5 (GLOVE) ×6 IMPLANT
GLOVE SURG UNDER LTX SZ7 (GLOVE) ×6 IMPLANT
GOWN STRL REUS W/ TWL LRG LVL3 (GOWN DISPOSABLE) ×3 IMPLANT
GOWN STRL REUS W/TWL LRG LVL3 (GOWN DISPOSABLE) ×6
GRASPER SUT TROCAR 14GX15 (MISCELLANEOUS) ×2 IMPLANT
IRRIGATION STRYKERFLOW (MISCELLANEOUS) ×1 IMPLANT
IRRIGATOR STRYKERFLOW (MISCELLANEOUS) ×2
IV NS 1000ML (IV SOLUTION) ×2
IV NS 1000ML BAXH (IV SOLUTION) ×1 IMPLANT
KIT TURNOVER KIT A (KITS) ×2 IMPLANT
KITTNER LAPARASCOPIC 5X40 (MISCELLANEOUS) ×2 IMPLANT
LABEL OR SOLS (LABEL) ×2 IMPLANT
MANIFOLD NEPTUNE II (INSTRUMENTS) ×2 IMPLANT
NEEDLE HYPO 22GX1.5 SAFETY (NEEDLE) ×2 IMPLANT
NS IRRIG 500ML POUR BTL (IV SOLUTION) ×2 IMPLANT
PACK LAP CHOLECYSTECTOMY (MISCELLANEOUS) ×2 IMPLANT
PENCIL ELECTRO HAND CTR (MISCELLANEOUS) ×2 IMPLANT
POUCH SPECIMEN RETRIEVAL 10MM (ENDOMECHANICALS) ×2 IMPLANT
RELOAD STAPLE TA45 3.5 REG BLU (ENDOMECHANICALS) ×2 IMPLANT
SCISSORS METZENBAUM CVD 33 (INSTRUMENTS) IMPLANT
SET TUBE SMOKE EVAC HIGH FLOW (TUBING) ×2 IMPLANT
SHEARS HARMONIC ACE PLUS 36CM (ENDOMECHANICALS) ×2 IMPLANT
SLEEVE ADV FIXATION 5X100MM (TROCAR) ×2 IMPLANT
STRIP CLOSURE SKIN 1/2X4 (GAUZE/BANDAGES/DRESSINGS) ×2 IMPLANT
SUT MNCRL 4-0 (SUTURE) ×2
SUT MNCRL 4-0 27XMFL (SUTURE) ×1
SUT VIC AB 3-0 SH 27 (SUTURE) ×2
SUT VIC AB 3-0 SH 27X BRD (SUTURE) ×1 IMPLANT
SUT VICRYL 0 AB UR-6 (SUTURE) ×2 IMPLANT
SUTURE MNCRL 4-0 27XMF (SUTURE) ×1 IMPLANT
SYS KII FIOS ACCESS ABD 5X100 (TROCAR) ×2
SYSTEM KII FIOS ACES ABD 5X100 (TROCAR) ×1 IMPLANT
TRAY FOLEY MTR SLVR 16FR STAT (SET/KITS/TRAYS/PACK) ×2 IMPLANT
TROCAR ADV FIXATION 12X100MM (TROCAR) IMPLANT
TROCAR BALLN GELPORT 12X130M (ENDOMECHANICALS) ×2 IMPLANT

## 2021-01-28 NOTE — Discharge Summary (Signed)
Physician Discharge Summary  Patient ID: Thomas Huff MRN: [id number] DOB/AGE: 1996/08/31 25 y.o.  Admit date: 01/28/2021 Discharge date: 01/28/2021  Admission Diagnoses: acute appendicitis  Discharge Diagnoses:  Active Problems:   Acute appendicitis s/p laparoscopic appendectomy  Discharged Condition: good  Hospital Course: Thomas Huff presented to the emergency department with right lower quadrant pain.  ED evaluation was consistent with acute appendicitis.  He was taken to the operating room where he underwent an uncomplicated laparoscopic appendectomy.  He recovered without difficulty in the postanesthesia care unit and was subsequently deemed stable for discharge to home.  Consults: None  Significant Diagnostic Studies: labs: CBC with mild leukocytosis and radiology: CT scan: Consistent with acute appendicitis  Treatments: IV hydration, antibiotics: Cipro and metronidazole, analgesia: Multimodal pain control and surgery: Laparoscopic appendectomy  Discharge Exam: Blood pressure 117/64, pulse 78, temperature (!) 97.1 F (36.2 C), temperature source Temporal, resp. rate 16, height 6' (1.829 m), weight 88.5 kg, SpO2 99 %. General appearance: alert and no distress Resp: Normal work of breathing on room air Cardio: regular rate and rhythm Incision/Wound: Laparoscopic port sites with Steri-Strips present.  Disposition: Discharge disposition: 01-Home or Self Care       Discharge Instructions    Call MD for:  difficulty breathing, headache or visual disturbances   Complete by: As directed    Call MD for:  extreme fatigue   Complete by: As directed    Call MD for:  hives   Complete by: As directed    Call MD for:  persistant dizziness or light-headedness   Complete by: As directed    Call MD for:  persistant nausea and vomiting   Complete by: As directed    Call MD for:  redness, tenderness, or signs of infection (pain, swelling, redness, odor or green/yellow discharge  around incision site)   Complete by: As directed    Call MD for:  severe uncontrolled pain   Complete by: As directed    Call MD for:  temperature >100.4   Complete by: As directed    Diet - low sodium heart healthy   Complete by: As directed    Discharge wound care:   Complete by: As directed    You may shower starting on Wednesday.  Do not submerge the incisions or allow them to become saturated with water or sweat.  It is okay if they get a little bit damp; just pat them dry gently with a clean soft towel allow the Steri-Strips (paper tapes) to fall off on their own.   Driving Restrictions   Complete by: As directed    No driving for 1 week or while taking narcotic pain medication.   Increase activity slowly   Complete by: As directed    Lifting restrictions   Complete by: As directed    Do not lift, push, or pull anything heavier than 10 pounds for 3 weeks.     Allergies as of 01/28/2021      Reactions   Penicillins Anaphylaxis, Hives, Swelling, Other (See Comments)   Has patient had a PCN reaction causing immediate rash, facial/tongue/throat swelling, SOB or lightheadedness with hypotension: Yes Has patient had a PCN reaction causing severe rash involving mucus membranes or skin necrosis: No Has patient had a PCN reaction that required hospitalization: Yes Has patient had a PCN reaction occurring within the last 10 years: Unknown If all of the above answers are "NO", then may proceed with Cephalosporin use.   Hydrocodone Nausea And Vomiting  Medication List    TAKE these medications   acetaminophen 500 MG tablet Commonly known as: TYLENOL Take 2 tablets (1,000 mg total) by mouth every 6 (six) hours.   Albuterol Sulfate 108 (90 Base) MCG/ACT Aepb Commonly known as: PROAIR RESPICLICK Inhale 2 puffs into the lungs every 6 (six) hours as needed.   ibuprofen 800 MG tablet Commonly known as: ADVIL Take 1 tablet (800 mg total) by mouth every 8 (eight) hours as needed.    ondansetron 4 MG disintegrating tablet Commonly known as: Zofran ODT Take 1 tablet (4 mg total) by mouth every 8 (eight) hours as needed for nausea or vomiting.   tadalafil 5 MG tablet Commonly known as: CIALIS Take 1 tablet (5 mg total) by mouth daily as needed for erectile dysfunction.   traMADol 50 MG tablet Commonly known as: Ultram Take 1 tablet (50 mg total) by mouth every 6 (six) hours as needed.            Discharge Care Instructions  (From admission, onward)         Start     Ordered   01/28/21 0000  Discharge wound care:       Comments: You may shower starting on Wednesday.  Do not submerge the incisions or allow them to become saturated with water or sweat.  It is okay if they get a little bit damp; just pat them dry gently with a clean soft towel allow the Steri-Strips (paper tapes) to fall off on their own.   01/28/21 1020          Follow-up Information    Donovan Kail, PA-C. Schedule an appointment as soon as possible for a visit in 2 week(s).   Specialty: Physician Assistant Why: s/p lap appy  February 13, 2021 @ 10:15 am Contact information: [address] [place] [address] [phone]               Signed: Duanne Guess 01/28/2021, 12:15 PM

## 2021-01-28 NOTE — H&P (Addendum)
[facility] SURGICAL HISTORY & PHYSICAL (cpt [phone])  HISTORY OF PRESENT ILLNESS (HPI):  25 y.o. male presented to [facility] ED overnight for abdominal pain. Patient reports the gradual onset of RLQ abdominal pain throughout the curse of the day yesterday. It had been mild at first but progressively became sharp and more severe as the day progressed. Pain seems to be exacerbated with movement. He does endorse nausea. No fever, chills, cough, CP, SOB, emesis, urinary changes, or bowel changes. No history of similar in the past. He did reportedly have an umbilical hernia repair when he was 59 years old per his mother. Work up in the ED revealed mild leukocytosis to 11.4k and CT Abdomen/Pelvis was concerning for acute uncomplicated appendicitis.   General surgery is consulted by emergency medicine physician Dr Loleta Rose, MD for evaluation and management of acute appendicitis.    PAST MEDICAL HISTORY (PMH):  Past Medical History:  Diagnosis Date   Asthma    WELL CONTROLLED    Reviewed. Otherwise negative.   PAST SURGICAL HISTORY [facility]):  Past Surgical History:  Procedure Laterality Date   HARDWARE REMOVAL Left 03/18/2018   Procedure: HARDWARE REMOVAL LEFT 5TH METACARPAL;  Surgeon: Kennedy Bucker, MD;  Location: [facility] ORS;  Service: Orthopedics;  Laterality: Left;   OPEN REDUCTION INTERNAL FIXATION (ORIF) METACARPAL Left 01/14/2018   Procedure: OPEN REDUCTION INTERNAL FIXATION (ORIF) METACARPAL;  Surgeon: Kennedy Bucker, MD;  Location: [facility] ORS;  Service: Orthopedics;  Laterality: Left;  left 5th metacarpal    WISDOM TOOTH EXTRACTION      Reviewed. Otherwise negative.   MEDICATIONS:  Prior to Admission medications   Medication Sig Start Date End Date Taking? Authorizing Provider  Albuterol Sulfate 108 (90 Base) MCG/ACT AEPB Inhale 2 puffs into the lungs every 6 (six) hours as needed. 03/29/18  Yes [provider]  tadalafil (CIALIS) 5 MG tablet Take 1 tablet (5 mg total) by  mouth daily as needed for erectile dysfunction. 01/03/20  Yes Sondra Come, MD     ALLERGIES:  Allergies  Allergen Reactions   Penicillins Anaphylaxis, Hives, Swelling and Other (See Comments)    Has patient had a PCN reaction causing immediate rash, facial/tongue/throat swelling, SOB or lightheadedness with hypotension: Yes Has patient had a PCN reaction causing severe rash involving mucus membranes or skin necrosis: No Has patient had a PCN reaction that required hospitalization: Yes Has patient had a PCN reaction occurring within the last 10 years: Unknown If all of the above answers are "NO", then may proceed with Cephalosporin use.    Hydrocodone Nausea And Vomiting     SOCIAL HISTORY:  Social History   Socioeconomic History   Marital status: Single    Spouse name: Not on file   Number of children: Not on file   Years of education: Not on file   Highest education level: Not on file  Occupational History   Not on file  Tobacco Use   Smoking status: Never Smoker   Smokeless tobacco: Never Used  Vaping Use   Vaping Use: Some days  Substance and Sexual Activity   Alcohol use: Yes    Comment: OCC   Drug use: No   Sexual activity: Yes    Birth control/protection: None  Other Topics Concern   Not on file  Social History Narrative   Not on file   Social Determinants of Health   Financial Resource Strain: Not on file  Food Insecurity: Not on file  Transportation Needs: Not on file  Physical Activity: Not on file  Stress: Not on file  Social Connections: Not on file  Intimate Partner Violence: Not on file     FAMILY HISTORY:  Family History  Problem Relation Age of Onset   Asthma Father    Prostate cancer Neg Hx     Otherwise negative.   REVIEW OF SYSTEMS:  Review of Systems  Constitutional: Negative for chills and fever.  HENT: Negative for congestion and sore throat.   Respiratory: Negative for cough and shortness of breath.   Cardiovascular: Negative  for chest pain and palpitations.  Gastrointestinal: Positive for abdominal pain and nausea. Negative for blood in stool, constipation, diarrhea and vomiting.  Genitourinary: Negative for dysuria and urgency.  All other systems reviewed and are negative.   VITAL SIGNS:  Temp:  [98.5 F (36.9 C)] 98.5 F (36.9 C) (02/27 2323) Pulse Rate:  [65-101] 65 (02/28 0321) Resp:  [16-20] 20 (02/28 0321) BP: (120-150)/(67-85) 124/67 (02/28 0321) SpO2:  [92 %-99 %] 92 % (02/28 0321) Weight:  [88.5 kg] 88.5 kg (02/27 2324)     Height: 6' (182.9 cm) Weight: 88.5 kg BMI (Calculated): 26.44   PHYSICAL EXAM:  Physical Exam Vitals and nursing note reviewed. Exam conducted with a chaperone present.  Constitutional:      General: He is not in acute distress.    Appearance: He is well-developed.  HENT:     Head: Normocephalic and atraumatic.  Cardiovascular:     Rate and Rhythm: Normal rate and regular rhythm.     Heart sounds: Normal heart sounds. No murmur heard.   Pulmonary:     Effort: Pulmonary effort is normal. No respiratory distress.     Breath sounds: Normal breath sounds.  Abdominal:     General: Abdomen is flat. There is no distension.     Palpations: Abdomen is soft.     Tenderness: There is abdominal tenderness in the right lower quadrant. There is no guarding or rebound. Positive signs include McBurney's sign. Negative signs include Rovsing's sign.  Genitourinary:    Comments: Deferred Skin:    General: Skin is warm and dry.     Coloration: Skin is not jaundiced or pale.  Neurological:     General: No focal deficit present.     Mental Status: He is alert and oriented to person, place, and time.  Psychiatric:        Mood and Affect: Mood normal.        Behavior: Behavior normal.     INTAKE/OUTPUT:  This shift: No intake/output data recorded.  Last 2 shifts: @IOLAST2SHIFTS @  Labs:  CBC Latest Ref Rng & Units 01/27/2021  WBC 4.0 - 10.5 K/uL 11.4(H)  Hemoglobin 13.0 - 17.0  g/dL 01/29/2021  Hematocrit 01.0 - 52.0 % 48.0  Platelets 150 - 400 K/uL 217   CMP Latest Ref Rng & Units 01/27/2021  Glucose 70 - 99 mg/dL 01/29/2021)  BUN 6 - 20 mg/dL 13  Creatinine 355(D - 3.22 mg/dL 0.25  Sodium 4.27 - 062 mmol/L 135  Potassium 3.5 - 5.1 mmol/L 3.9  Chloride 98 - 111 mmol/L 103  CO2 22 - 32 mmol/L 22  Calcium 8.9 - 10.3 mg/dL 376  Total Protein 6.5 - 8.1 g/dL 28.3)  Total Bilirubin 0.3 - 1.2 mg/dL 0.8  Alkaline Phos 38 - 126 U/L 81  AST 15 - 41 U/L 23  ALT 0 - 44 U/L 20     Imaging studies:   CT Abdomen/Pelvis (01/28/2021) personally reviewed showing  stranding surrounding the appendix without evidence of perforation nor abscess and radiologist report reviewed below:  IMPRESSION: 1. Compatible with early Acute Appendicitis: airless, slightly enlarged, and minimally inflamed appendix. No complicating features. 2. Otherwise negative CT Abdomen and Pelvis.   Assessment/Plan: (ICD-10's: K35.80) 25 y.o. male with leukocytosis and RLQ abdominal pain concerning for acute uncomplicated appendicitis.    - Will admit to general surgery  - Will plan for laparoscopic appendectomy today with Dr Lady Gary pending OR/Anesthesia availability  - All risks, benefits, and alternatives to above procedure(s) were discussed with the patient, all of his questions were answered to his expressed satisfaction, patient expresses he wishes to proceed, and informed consent was obtained.  - NPO + IVF Resuscitation  - IV ABx (Cipro + Flagyl)  - Monitor abdominal examination; on-going bowel function  - Pain control prn; antiemetics prn    - DVT prophylaxis; hold for OR  All of the above findings and recommendations were discussed with the patient, and all of his questions were answered to his expressed satisfaction.  -- Lynden Oxford, PA-C [facility] 01/28/2021, 7:04 AM [phone] M-F: 7am - 4pm   I saw and evaluated the patient.  I agree with the above documentation,  exam, and plan, which I have edited where appropriate. Duanne Guess  8:50 AM

## 2021-01-28 NOTE — Anesthesia Procedure Notes (Signed)
Procedure Name: Intubation Date/Time: 01/28/2021 9:06 AM Performed by: Doreen Salvage, CRNA Pre-anesthesia Checklist: Patient identified, Patient being monitored, Timeout performed, Emergency Drugs available and Suction available Patient Re-evaluated:Patient Re-evaluated prior to induction Oxygen Delivery Method: Circle system utilized Preoxygenation: Pre-oxygenation with 100% oxygen Induction Type: IV induction Ventilation: Mask ventilation without difficulty Laryngoscope Size: Mac, McGraph and 4 Grade View: Grade I Tube type: Oral Tube size: 7.5 mm Number of attempts: 1 Airway Equipment and Method: Stylet Placement Confirmation: ETT inserted through vocal cords under direct vision,  positive ETCO2 and breath sounds checked- equal and bilateral Secured at: 21 cm Tube secured with: Tape Dental Injury: Teeth and Oropharynx as per pre-operative assessment

## 2021-01-28 NOTE — ED Notes (Signed)
Surgical PAs in with pt.

## 2021-01-28 NOTE — Discharge Instructions (Signed)
AMBULATORY SURGERY  DISCHARGE INSTRUCTIONS   1) The drugs that you were given will stay in your system until tomorrow so for the next 24 hours you should not:  A) Drive an automobile B) Make any legal decisions C) Drink any alcoholic beverage   2) You may resume regular meals tomorrow.  Today it is better to start with liquids and gradually work up to solid foods.  You may eat anything you prefer, but it is better to start with liquids, then soup and crackers, and gradually work up to solid foods.   3) Please notify your doctor immediately if you have any unusual bleeding, trouble breathing, redness and pain at the surgery site, drainage, fever, or pain not relieved by medication. 4)   5) Your post-operative visit with Dr.                                     is: Date:                        Time:    Please call to schedule your post-operative visit.  6) Additional Instructions:       Laparoscopic Appendectomy, Adult, Care After This sheet gives you information about how to care for yourself after your procedure. Your health care provider may also give you more specific instructions. If you have problems or questions, contact your health care provider. What can I expect after the procedure? After the procedure, it is common to have:  Little energy for normal activities.  Mild pain in the area where the incisions were made.  Difficulty passing stool (constipation). This can be caused by: ? Pain medicine. ? A decrease in your activity. Follow these instructions at home: Medicines  Take over-the-counter and prescription medicines only as told by your health care provider.  If you were prescribed an antibiotic medicine, take it as told by your health care provider. Do not stop taking the antibiotic even if you start to feel better.  Do not drive or use heavy machinery while taking prescription pain medicine.  Ask your health care provider if the medicine prescribed to  you can cause constipation. You may need to take steps to prevent or treat constipation, such as: ? Drink enough fluid to keep your urine pale yellow. ? Take over-the-counter or prescription medicines. ? Eat foods that are high in fiber, such as beans, whole grains, and fresh fruits and vegetables. ? Limit foods that are high in fat and processed sugars, such as fried or sweet foods. Incision care  Follow instructions from your health care provider about how to take care of your incisions. Make sure you: ? Wash your hands with soap and water before and after you change your bandage (dressing). If soap and water are not available, use hand sanitizer. ? Change your dressing as told by your health care provider. ? Leave stitches (sutures), skin glue, or adhesive strips in place. These skin closures may need to stay in place for 2 weeks or longer. If adhesive strip edges start to loosen and curl up, you may trim the loose edges. Do not remove adhesive strips completely unless your health care provider tells you to do that.  Check your incision areas every day for signs of infection. Check for: ? Redness, swelling, or pain. ? Fluid or blood. ? Warmth. ? Pus or a bad smell.  Bathing  Keep your incisions clean and dry. Clean them as often as told by your health care provider. To do this: 1. Gently wash the incisions with soap and water. 2. Rinse the incisions with water to remove all soap. 3. Pat the incisions dry with a clean towel. Do not rub the incisions.  Do not take baths, swim, or use a hot tub for 2 weeks, or until your health care provider approves. You may take showers after 48 hours. Activity  Do not drive for 24 hours if you were given a sedative during your procedure.  Rest after the procedure. Return to your normal activities as told by your health care provider. Ask your health care provider what activities are safe for you.  For 3 weeks, or for as long as told by your health  care provider: ? Do not lift anything that is heavier than 10 lb (4.5 kg), or the limit that you are told. ? Do not play contact sports.   General instructions  If you were sent home with a drain, follow instructions from your health care provider about how to care for it.  Take deep breaths. This helps to prevent your lungs from developing an infection (pneumonia).  Keep all follow-up visits as told by your health care provider. This is important. Contact a health care provider if:  You have redness, swelling, or pain around an incision.  You have fluid or blood coming from an incision.  Your incision feels warm to the touch.  You have pus or a bad smell coming from an incision or dressing.  Your incision edges break open after your sutures have been removed.  You have increasing pain in your shoulders.  You feel dizzy or you faint.  You develop shortness of breath.  You keep feeling nauseous or you are vomiting.  You have diarrhea or you cannot control your bowel functions.  You lose your appetite.  You develop swelling or pain in your legs.  You develop a rash. Get help right away if you have:  A fever.  Difficulty breathing.  Sharp pains in your chest. Summary  After a laparoscopic appendectomy, it is common to have little energy for normal activities, mild pain in the area of the incisions, and constipation.  Infection is the most common complication after this procedure. Follow your health care provider's instructions about caring for yourself after the procedure.  Rest after the procedure. Return to your normal activities as told by your health care provider.  Contact your health care provider if you notice signs of infection around your incisions or you develop shortness of breath. Get help right away if you have a fever, chest pain, or difficulty breathing. This information is not intended to replace advice given to you by your health care provider. Make  sure you discuss any questions you have with your health care provider. Document Revised: 05/20/2018 Document Reviewed: 05/20/2018 Elsevier Patient Education  2021 [employer]. In addition to included general post-operative instructions for laparoscopic appendectomy,  Diet: Resume home diet.   Activity: No heavy lifting >10 pounds (children, pets, laundry, garbage) for 3 weeks, but light activity and walking are encouraged. Do not drive or drink alcohol if taking narcotic pain medications or having pain that might distract from driving.  Wound care: 2 days after surgery (03/02), you may shower and pat dry (do not rub incisions), but no baths or submerging incision underwater until follow-up.   Medications: Resume all home medications. For  mild to moderate pain: acetaminophen (Tylenol) or ibuprofen/naproxen (if no kidney disease). Combining Tylenol with alcohol can substantially increase your risk of causing liver disease. Narcotic pain medications, if prescribed, can be used for severe pain, though may cause nausea, constipation, and drowsiness. Do not combine Tylenol and Percocet (or similar) within a 6 hour period as Percocet (and similar) contain(s) Tylenol. If you do not need the narcotic pain medication, you do not need to fill the prescription.  Call office [phone] / [phone]) at any time if any questions, worsening pain, fevers/chills, bleeding, drainage from incision site, or other concerns.

## 2021-01-28 NOTE — Op Note (Signed)
Operative Note  Laparoscopic Appendectomy   Thomas Huff Needle Date of operation:  01/28/2021  Indications: The patient presented with a history of  abdominal pain. Workup has revealed findings consistent with acute appendicitis.  Pre-operative Diagnosis: Acute appendicitis without mention of peritonitis  Post-operative Diagnosis: Same  Surgeon: Duanne Guess, MD  Anesthesia: GETA  Findings: Mild inflammation of the appendix without abscess, perforation, or gangrene.  Estimated Blood Loss: Less than 2 cc         Specimens: appendix         Complications: None immediately apparent  Procedure Details  The patient was seen again in the preop area. The options of surgery versus observation were reviewed with the patient and/or family. The risks of bleeding, infection, recurrence of symptoms, negative laparoscopy, potential for an open procedure, bowel injury, abscess or infection, were all reviewed as well. The patient was taken to Operating Room, identified as Thomas Huff and the procedure verified as laparoscopic appendectomy. A time out was performed and the above information confirmed.  The patient was placed in the supine position and general anesthesia was induced.  Antibiotic prophylaxis was administered and VT E prophylaxis was in place. A Foley catheter was placed by the nursing staff.   The abdomen was prepped and draped in a sterile fashion. A supraumbilical incision was made. A cutdown technique was used to enter the abdominal cavity. Two vicryl stitches were placed on the fascia and a Hasson trocar inserted. Pneumoperitoneum obtained. Two 5 mm ports were placed under direct visualization.  The appendix was identified and found to be acutely inflamed but without concern for perforation.  No abscess or gangrene. The appendix was carefully dissected away from the surrounding tissues. The mesoappendix was divided with the Harmonic scalpel. The base of the appendix was  dissected out and divided with a standard load Endo GIA.The appendix was placed in a Endopouch bag and removed via the Hasson port. The right lower quadrant and pelvis was then irrigated with normal saline which was then aspirated. The right lower quadrant was inspected there was no sign of bleeding or bowel injury therefore pneumoperitoneum was released, all ports were removed.  The umbilical fascia was closed with 0 Vicryl interrupted sutures and the skin incisions were approximated with subcuticular 4-0 Monocryl. Dermabond was applied The patient tolerated the procedure well and there were no immediately apparent complications. The sponge lap and needle count were correct at the end of the procedure.  The patient was taken to the recovery room in stable condition to be admitted for continued care.   Duanne Guess, MD, FACS

## 2021-01-28 NOTE — ED Provider Notes (Signed)
[facility] Emergency Department Provider Note  ____________________________________________   Event Date/Time   First MD Initiated Contact with Patient 01/28/21 0259     (approximate)  I have reviewed the triage vital signs and the nursing notes.   HISTORY  Chief Complaint Abdominal Pain (Right)    HPI Thomas Huff is a 25 y.o. male with no chronic medical issues who presents for evaluation of gradually worsening right lower quadrant pain that has become sharp and severe.  It started about 4:00 this afternoon (approximately 11 hours ago) and has steadily gotten worse.  He thought maybe he could sleep it off but it woke him up from sleep with severe pain particularly when he moves around or presses on the area.  He has had nausea but no vomiting.  Nothing to eat recently.  Nothing in particular makes the pain better.  He denies fever, chest pain, sore throat,  vomiting, and upper abdominal pain.        Past Medical History:  Diagnosis Date   Asthma    WELL CONTROLLED    There are no problems to display for this patient.   Past Surgical History:  Procedure Laterality Date   HARDWARE REMOVAL Left 03/18/2018   Procedure: HARDWARE REMOVAL LEFT 5TH METACARPAL;  Surgeon: Kennedy Bucker, MD;  Location: [facility] ORS;  Service: Orthopedics;  Laterality: Left;   OPEN REDUCTION INTERNAL FIXATION (ORIF) METACARPAL Left 01/14/2018   Procedure: OPEN REDUCTION INTERNAL FIXATION (ORIF) METACARPAL;  Surgeon: Kennedy Bucker, MD;  Location: [facility] ORS;  Service: Orthopedics;  Laterality: Left;  left 5th metacarpal    WISDOM TOOTH EXTRACTION      Prior to Admission medications   Medication Sig Start Date End Date Taking? Authorizing Provider  Albuterol Sulfate 108 (90 Base) MCG/ACT AEPB Inhale into the lungs. 03/29/18   [provider]  tadalafil (CIALIS) 5 MG tablet Take 1 tablet (5 mg total) by mouth daily as needed for erectile dysfunction. 01/03/20    Sondra Come, MD    Allergies Penicillins and Hydrocodone  Family History  Problem Relation Age of Onset   Asthma Father    Prostate cancer Neg Hx     Social History Social History   Tobacco Use   Smoking status: Never Smoker   Smokeless tobacco: Never Used  Building services engineer Use: Some days  Substance Use Topics   Alcohol use: Yes    Comment: OCC   Drug use: No    Review of Systems Constitutional: No fever/chills Eyes: No visual changes. ENT: No sore throat. Cardiovascular: Denies chest pain. Respiratory: Denies shortness of breath. Gastrointestinal: Gradually worsening sharp right lower quadrant pain with nausea. Genitourinary: Negative for dysuria. Musculoskeletal: Negative for neck pain.  Negative for back pain. Integumentary: Negative for rash. Neurological: Negative for headaches, focal weakness or numbness.   ____________________________________________   PHYSICAL EXAM:  VITAL SIGNS: ED Triage Vitals  Enc Vitals Group     BP 01/27/21 2323 (!) 150/77     Pulse Rate 01/27/21 2323 (!) 101     Resp 01/27/21 2323 18     Temp 01/27/21 2323 98.5 F (36.9 C)     Temp src --      SpO2 01/27/21 2323 99 %     Weight 01/27/21 2324 88.5 kg (195 lb)     Height 01/27/21 2324 1.829 m (6')     Head Circumference --      Peak Flow --  Pain Score 01/27/21 2323 6     Pain Loc --      Pain Edu? --      Excl. in GC? --     Constitutional: Alert and oriented.  Appears uncomfortable but nontoxic. Eyes: Conjunctivae are normal.  Head: Atraumatic. Nose: No congestion/rhinnorhea. Mouth/Throat: Patient is wearing a mask. Neck: No stridor.  No meningeal signs.   Cardiovascular: Normal rate, regular rhythm. Good peripheral circulation. Respiratory: Normal respiratory effort.  No retractions. Gastrointestinal: Soft and nondistended.  No upper abdominal tenderness.  Patient has exquisite tenderness to palpation of the right lower quadrant with guarding.   No obvious rebound but the exam is consistent with localized peritonitis. Musculoskeletal: No lower extremity tenderness nor edema. No gross deformities of extremities. Neurologic:  Normal speech and language. No gross focal neurologic deficits are appreciated.  Skin:  Skin is warm, dry and intact. Psychiatric: Mood and affect are normal. Speech and behavior are normal.  ____________________________________________   LABS (all labs ordered are listed, but only abnormal results are displayed)  Labs Reviewed  COMPREHENSIVE METABOLIC PANEL - Abnormal; Notable for the following components:      Result Value   Glucose, Bld 152 (*)    Total Protein 8.6 (*)    All other components within normal limits  CBC - Abnormal; Notable for the following components:   WBC 11.4 (*)    RDW 11.4 (*)    All other components within normal limits  URINALYSIS, COMPLETE (UACMP) WITH MICROSCOPIC - Abnormal; Notable for the following components:   Color, Urine YELLOW (*)    APPearance CLEAR (*)    All other components within normal limits  RESP PANEL BY RT-PCR (FLU A&B, COVID) ARPGX2  LIPASE, BLOOD   ____________________________________________  EKG  No indication for emergent EKG ____________________________________________  RADIOLOGY I, Loleta Roseory Absalom Aro, personally viewed and evaluated these images (plain radiographs) as part of my medical decision making, as well as reviewing the written report by the radiologist.  ED MD interpretation: Early appendicitis without complication  Official radiology report(s): CT ABDOMEN PELVIS W CONTRAST  Result Date: 01/28/2021 CLINICAL DATA:  25 year old male with right lower quadrant abdominal pain since 1700 hours yesterday. EXAM: CT ABDOMEN AND PELVIS WITH CONTRAST TECHNIQUE: Multidetector CT imaging of the abdomen and pelvis was performed using the standard protocol following bolus administration of intravenous contrast. CONTRAST:  100mL OMNIPAQUE IOHEXOL 300 MG/ML   SOLN COMPARISON:  None. FINDINGS: Lower chest: Negative. Hepatobiliary: Negative liver and gallbladder. Pancreas: Negative. Spleen: Negative. Adrenals/Urinary Tract: Negative. No urinary calculus. Incidental right hemipelvis phlebolith. Stomach/Bowel: Negative large bowel from the rectum to the ascending colon aside from mild redundancy. The appendix is located medial and caudal to the cecum, does not contain any gas, and demonstrates suggestion of mucosal hyperenhancement (series 5, image 45). Appendix: Location: Medial and caudal to the cecum Diameter: 8-9 mm at the tip.  7 mm at the base. Appendicolith: None Mucosal hyper-enhancement: Positive Extraluminal gas: None Periappendiceal collection: None. Minimal Peri appendiceal inflammatory stranding. Negative terminal ileum. No distal small bowel inflammation. No dilated small bowel. Decompressed stomach and duodenum. No free air. No free fluid. Vascular/Lymphatic: Major arterial structures are patent and normal. Portal venous system appears to be patent. No lymphadenopathy. Reproductive: Negative. Other: No pelvic free fluid. Musculoskeletal: Negative. IMPRESSION: 1. Compatible with early Acute Appendicitis: airless, slightly enlarged, and minimally inflamed appendix. No complicating features. 2. Otherwise negative CT Abdomen and Pelvis. Electronically Signed   By: Odessa FlemingH  Hall M.D.   On: 01/28/2021  04:15    ____________________________________________   PROCEDURES   Procedure(s) performed (including Critical Care):  Procedures   ____________________________________________   INITIAL IMPRESSION / MDM / ASSESSMENT AND PLAN / ED COURSE  As part of my medical decision making, I reviewed the following data within the electronic MEDICAL RECORD NUMBER Nursing notes reviewed and incorporated, Labs reviewed , Old chart reviewed, Discussed with admitting physician (Dr. Lady Gary) and reviewed Notes from prior ED visits   Differential diagnosis includes, but is not  limited to, appendicitis, epiploic appendagitis, diverticulitis, renal/ureteral colic.  Patient has localized peritonitis and I am most concerned about appendicitis.  His lab work is generally reassuring with a normal comprehensive metabolic panel and a very slight leukocytosis of 11.4.  Lipase is normal, LFTs are normal.  He has not yet provided a urine specimen but the symptoms are much more consistent with appendicitis than ureteral colic.  His vital signs are stable although he was mildly tachycardic upon arrival.  IV, morphine 4 mg IV, Zofran 4 mg IV, COVID swab, CT scan of the abdomen and pelvis with IV contrast.  Patient understands and agrees with the plan.     Clinical Course as of 01/28/21 0435  Mon Jan 28, 2021  5465 Urinalysis, Complete w Microscopic(!) Normal urinalysis, no evidence of hematuria nor infection. [CF]  0426 CT ABDOMEN PELVIS W CONTRAST CT scan of the abdomen and pelvis was concerning for early appendicitis without complication.  We will hold off on antibiotics until I discussed the case with Dr. Lady Gary whom my secretary is paging. [CF]  [phone] Discussed case by phone with Dr. Lady Gary who will admit the patient.  We discussed the patient's antibiotic allergies and she agreed with my plan for Cipro 400 mg IV and metronidazole 500 mg IV.  I updated the patient about the plan as well and he understands that he will be admitted and see Dr. Lady Gary later today.  His pain is better controlled after morphine and he says he does not need any additional pain medicine at this time.  I reminded him to remain n.p.o. [CF]    Clinical Course User Index [CF] Loleta Rose, MD     ____________________________________________  FINAL CLINICAL IMPRESSION(S) / ED DIAGNOSES  Final diagnoses:  Acute appendicitis with localized peritonitis, without perforation, abscess, or gangrene     MEDICATIONS GIVEN DURING THIS VISIT:  Medications  ciprofloxacin (CIPRO) IVPB 400 mg (has no  administration in time range)    And  metroNIDAZOLE (FLAGYL) IVPB 500 mg (has no administration in time range)  morphine 4 MG/ML injection 4 mg (4 mg Intravenous Given 01/28/21 0329)  ondansetron (ZOFRAN) injection 4 mg (4 mg Intravenous Given 01/28/21 0329)  iohexol (OMNIPAQUE) 300 MG/ML solution 100 mL (100 mLs Intravenous Contrast Given 01/28/21 0352)     ED Discharge Orders    None      *Please note:  Thomas Huff was evaluated in Emergency Department on 01/28/2021 for the symptoms described in the history of present illness. He was evaluated in the context of the global COVID-19 pandemic, which necessitated consideration that the patient might be at risk for infection with the SARS-CoV-2 virus that causes COVID-19. Institutional protocols and algorithms that pertain to the evaluation of patients at risk for COVID-19 are in a state of rapid change based on information released by regulatory bodies including the CDC and federal and state organizations. These policies and algorithms were followed during the patient's care in the ED.  Some ED evaluations and  interventions may be delayed as a result of limited staffing during and after the pandemic.*  Note:  This document was prepared using Dragon voice recognition software and may include unintentional dictation errors.   Loleta Rose, MD 01/28/21 [phone]

## 2021-01-28 NOTE — ED Notes (Signed)
Pt sitting in lobby in no acute distress.  

## 2021-01-28 NOTE — Transfer of Care (Signed)
Immediate Anesthesia Transfer of Care Note  Patient: Thomas Huff  Procedure(s) Performed: Procedure(s): APPENDECTOMY LAPAROSCOPIC (N/A)  Patient Location: PACU  Anesthesia Type:General  Level of Consciousness: sedated  Airway & Oxygen Therapy: Patient Spontanous Breathing and Patient connected to face mask oxygen  Post-op Assessment: Report given to RN and Post -op Vital signs reviewed and stable  Post vital signs: Reviewed and stable  Last Vitals:  Vitals:   01/28/21 0754 01/28/21 1016  BP: 116/69 117/60  Pulse: 76 66  Resp: 12 16  Temp: 37.1 C (!) 36.2 C  SpO2: 100% 99%    Complications: No apparent anesthesia complications

## 2021-01-28 NOTE — Progress Notes (Signed)
Briefly, this is a 25 y/o M presenting to the ED with classic symptoms of acute appendicitis.  CT scan consistent with same.  Plan for appendectomy later today, pending complete evaluation and discussion of options with the patient.  Full H&P to follow.

## 2021-01-28 NOTE — Anesthesia Postprocedure Evaluation (Signed)
Anesthesia Post Note  Patient: Thomas Huff  Procedure(s) Performed: APPENDECTOMY LAPAROSCOPIC (N/A )  Patient location during evaluation: PACU Anesthesia Type: General Level of consciousness: awake and alert Pain management: pain level controlled Vital Signs Assessment: post-procedure vital signs reviewed and stable Respiratory status: spontaneous breathing and respiratory function stable Cardiovascular status: stable Anesthetic complications: no   No complications documented.   Last Vitals:  Vitals:   01/28/21 1109 01/28/21 1116  BP:  120/76  Pulse: 82 90  Resp: 14 (!) 23  Temp:    SpO2: 97% 98%    Last Pain:  Vitals:   01/28/21 1116  TempSrc:   PainSc: 4                  Hannan Hutmacher K

## 2021-01-28 NOTE — Anesthesia Preprocedure Evaluation (Signed)
Anesthesia Evaluation  Patient identified by MRN, date of birth, ID band Patient awake    Reviewed: Allergy & Precautions, NPO status , Patient's Chart, lab work & pertinent test results  History of Anesthesia Complications Negative for: history of anesthetic complications  Airway Mallampati: II       Dental   Pulmonary asthma , neg sleep apnea, neg COPD, Not current smoker,           Cardiovascular (-) hypertension(-) Past MI and (-) CHF (-) dysrhythmias (-) Valvular Problems/Murmurs     Neuro/Psych neg Seizures    GI/Hepatic Neg liver ROS, neg GERD  ,  Endo/Other  neg diabetes  Renal/GU negative Renal ROS     Musculoskeletal   Abdominal   Peds  Hematology   Anesthesia Other Findings   Reproductive/Obstetrics                             Anesthesia Physical Anesthesia Plan  ASA: II  Anesthesia Plan: General   Post-op Pain Management:    Induction: Intravenous  PONV Risk Score and Plan:   Airway Management Planned: Oral ETT  Additional Equipment:   Intra-op Plan:   Post-operative Plan:   Informed Consent: I have reviewed the patients History and Physical, chart, labs and discussed the procedure including the risks, benefits and alternatives for the proposed anesthesia with the patient or authorized representative who has indicated his/her understanding and acceptance.       Plan Discussed with:   Anesthesia Plan Comments:         Anesthesia Quick Evaluation

## 2021-01-29 LAB — SURGICAL PATHOLOGY

## 2021-02-13 ENCOUNTER — Encounter: Payer: Self-pay | Admitting: Physician Assistant

## 2021-02-13 ENCOUNTER — Other Ambulatory Visit: Payer: Self-pay

## 2021-02-13 ENCOUNTER — Ambulatory Visit (INDEPENDENT_AMBULATORY_CARE_PROVIDER_SITE_OTHER): Payer: BC Managed Care – PPO | Admitting: Physician Assistant

## 2021-02-13 VITALS — BP 117/79 | HR 84 | Temp 97.8°F | Ht 72.0 in | Wt 194.6 lb

## 2021-02-13 DIAGNOSIS — K353 Acute appendicitis with localized peritonitis, without perforation or gangrene: Secondary | ICD-10-CM

## 2021-02-13 DIAGNOSIS — Z09 Encounter for follow-up examination after completed treatment for conditions other than malignant neoplasm: Secondary | ICD-10-CM

## 2021-02-13 NOTE — Progress Notes (Signed)
[facility] POST-OP OFFICE VISIT  02/13/2021  HPI: Thomas Huff is a 25 y.o. male 18 days s/p laparoscopic appendectomy for acute appendicitis with Dr Lady Gary   He is doing really well No issues with pain, fever, chills, nausea, emesis Tolerating PO and having normal bowel function No other complaints.   Vital signs: BP 117/79   Pulse 84   Temp 97.8 F (36.6 C) (Oral)   Ht 6' (1.829 m)   Wt 194 lb 9.6 oz (88.3 kg)   SpO2 97%   BMI 26.39 kg/m    Physical Exam: Constitutional: Well appearing NAD Abdomen: Soft, non-tender, non-distended, no rebound/guarding Skin: Laparoscopic incisions are well healed, no erythema or drainage   Assessment/Plan: This is a 25 y.o. male 18 days s/p laparoscopic appendectomy for acute appendicitis   - Pain control prn; Tylenol / Motrin  - Reviewed wound care  - Reviewed lifting restrictions; 4 weeks total   - Reviewed pathology: Acute Appendicitis  - He can rtc on as needed basis   -- Lynden Oxford, PA-C [facility] 02/13/2021, 10:13 AM [phone] M-F: 7am - 4pm

## 2021-02-13 NOTE — Patient Instructions (Addendum)
If you have any concerns or questions, please feel free to call our office. You can take Motrin and Tylenol for pain, and shower and bathe as normal.    GENERAL POST-OPERATIVE PATIENT INSTRUCTIONS   WOUND CARE INSTRUCTIONS:  Keep a dry clean dressing on the wound if there is drainage. The initial bandage may be removed after 24 hours.  Once the wound has quit draining you may leave it open to air.  If clothing rubs against the wound or causes irritation and the wound is not draining you may cover it with a dry dressing during the daytime.  Try to keep the wound dry and avoid ointments on the wound unless directed to do so.  If the wound becomes bright red and painful or starts to drain infected material that is not clear, please contact your physician immediately.  If the wound is mildly pink and has a thick firm ridge underneath it, this is normal, and is referred to as a healing ridge.  This will resolve over the next 4-6 weeks.  BATHING: You may shower if you have been informed of this by your surgeon. However, Please do not submerge in a tub, hot tub, or pool until incisions are completely sealed or have been told by your surgeon that you may do so.  DIET:  You may eat any foods that you can tolerate.  It is a good idea to eat a high fiber diet and take in plenty of fluids to prevent constipation.  If you do become constipated you may want to take a mild laxative or take ducolax tablets on a daily basis until your bowel habits are regular.  Constipation can be very uncomfortable, along with straining, after recent surgery.  ACTIVITY:  You are encouraged to cough and deep breath or use your incentive spirometer if you were given one, every 15-30 minutes when awake.  This will help prevent respiratory complications and low grade fevers post-operatively if you had a general anesthetic.  You may want to hug a pillow when coughing and sneezing to add additional support to the surgical area, if you had  abdominal or chest surgery, which will decrease pain during these times.  You are encouraged to walk and engage in light activity for the next two weeks.  You should not lift more than 15-20 pounds, until 02/25/2021 as it could put you at increased risk for complications.  Twenty pounds is roughly equivalent to a plastic bag of groceries. At that time- Listen to your body when lifting, if you have pain when lifting, stop and then try again in a few days. Soreness after doing exercises or activities of daily living is normal as you get back in to your normal routine.  MEDICATIONS:  Try to take narcotic medications and anti-inflammatory medications, such as tylenol, ibuprofen, naprosyn, etc., with food.  This will minimize stomach upset from the medication.  Should you develop nausea and vomiting from the pain medication, or develop a rash, please discontinue the medication and contact your physician.  You should not drive, make important decisions, or operate machinery when taking narcotic pain medication.  SUNBLOCK Use sun block to incision area over the next year if this area will be exposed to sun. This helps decrease scarring and will allow you avoid a permanent darkened area over your incision.       Laparoscopic Appendectomy, Adult, Care After This sheet gives you information about how to care for yourself after your procedure. Your doctor may  also give you more specific instructions. If you have problems or questions, contact your doctor. What can I expect after the procedure? After the procedure, it is common to have:  Little energy for normal activities.  Mild pain in the area where the cuts from surgery (incisions) were made.  Trouble pooping (constipation). This can be caused by: ? Pain medicine. ? A lack of activity. Follow these instructions at home: Medicines  Take over-the-counter and prescription medicines only as told by your doctor.  If you were prescribed an antibiotic  medicine, take it as told by your doctor. Do not stop taking it even if you start to feel better.  Do not drive or use heavy machinery while taking prescription pain medicine.  Ask your doctor if the medicine you are taking can cause trouble pooping. You may need to take steps to prevent or treat trouble pooping: ? Drink enough fluid to keep your pee (urine) pale yellow. ? Take over-the-counter or prescription medicines. ? Eat foods that are high in fiber. These include beans, whole grains, and fresh fruits and vegetables. ? Limit foods that are high in fat and sugar. These include fried or sweet foods. Incision care  Follow instructions from your doctor about how to take care of your cuts from surgery. Make sure you: ? Wash your hands with soap and water before and after you change your bandage (dressing). If you cannot use soap and water, use hand sanitizer. ? Change your bandage as told by your doctor. ? Leave stitches (sutures), skin glue, or skin tape (adhesive) strips in place. They may need to stay in place for 2 weeks or longer. If tape strips get loose and curl up, you may trim the loose edges. Do not remove tape strips completely unless your doctor says it is okay.  Check your cuts from surgery every day for signs of infection. Check for: ? Redness, swelling, or pain. ? Fluid or blood. ? Warmth. ? Pus or a bad smell.   Bathing  Keep your cuts from surgery clean and dry. Clean them as told by your doctor. To do this: 1. Gently wash the cuts with soap and water. 2. Rinse the cuts with water to remove all soap. 3. Pat the cuts dry with a clean towel. Do not rub the cuts.  Do not take baths, swim, or use a hot tub for 2 weeks, or until your doctor says it is okay. You may take showers after 48 hours. Activity  Do not drive for 24 hours if you were given a medicine to help you relax (sedative) during your procedure.  Rest after the procedure. Return to your normal activities as  told by your doctor. Ask your doctor what activities are safe for you.  For 3 weeks, or for as long as told by your doctor: ? Do not lift anything that is heavier than 10 lb (4.5 kg), or the limit that you are told. ? Do not play contact sports.   General instructions  If you were sent home with a drain, follow instructions from your doctor on how to care for it.  Take deep breaths. This helps to keep your lungs from getting an infection (pneumonia).  Keep all follow-up visits as told by your doctor. This is important. Contact a doctor if:  You have redness, swelling, or pain around a cut from surgery.  You have fluid or blood coming from a cut.  Your cut feels warm to the touch.  You  have pus or a bad smell coming from a cut or a bandage.  The edges of a cut break open after the stitches have been taken out.  You have pain in your shoulders that gets worse.  You feel dizzy or you pass out (faint).  You have shortness of breath.  You keep feeling sick to your stomach (nauseous).  You keep throwing up (vomiting).  You get watery poop (diarrhea) or you cannot control your poop.  You lose your appetite.  You have swelling or pain in your legs.  You get a rash. Get help right away if:  You have a fever.  You have trouble breathing.  You have sharp pains in your chest. Summary  After the procedure, it is common to have low energy, mild pain, and trouble pooping.  Infection is a common problem after this procedure. Follow your doctor's instructions about caring for yourself after the procedure.  Rest after the procedure. Return to your normal activities as told by your doctor.  Contact your doctor if you see signs of infection around your cuts from surgery, or you get short of breath. Get help right away if you have a fever, chest pain, or trouble breathing. This information is not intended to replace advice given to you by your health care provider. Make sure you  discuss any questions you have with your health care provider. Document Revised: 05/20/2018 Document Reviewed: 05/20/2018 Elsevier Patient Education  2021 [employer].

## 2021-03-17 IMAGING — CT CT ABD-PELV W/ CM
2 of 4 series · 16 of 46 positions shown, 18 images · IV contrast (APPLIED)
Comparison: None.

CLINICAL DATA: 24-year-old male with right lower quadrant abdominal
pain since 1900 hours yesterday.

EXAM:
CT ABDOMEN AND PELVIS WITH CONTRAST
TECHNIQUE: Multidetector CT imaging of the abdomen and pelvis was performed
using the standard protocol following bolus administration of
intravenous contrast.
CONTRAST:  100mL OMNIPAQUE IOHEXOL 300 MG/ML  SOLN

[Series 2: routine abd/pel with · axial · 0.80mm/px · z∈[-974,-479]mm · 13 of 109 slices shown, 15 images]
[im 5/109  soft-tissue]
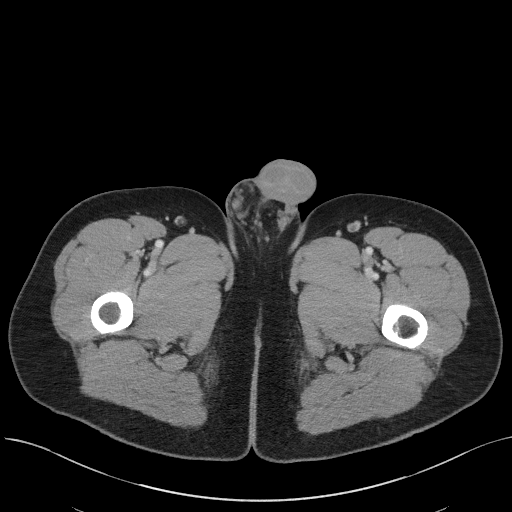
[im 5/109  bone]
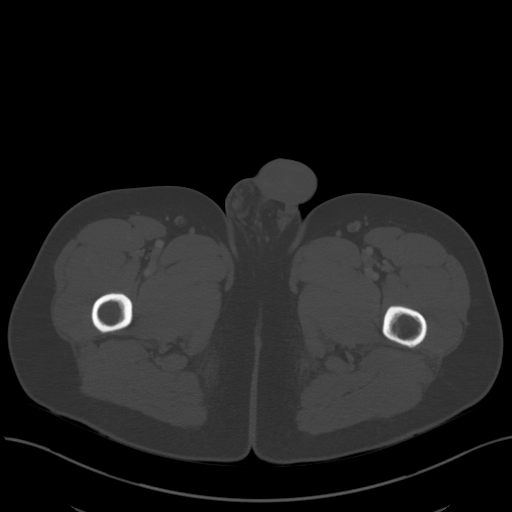
[im 14/109  soft-tissue]
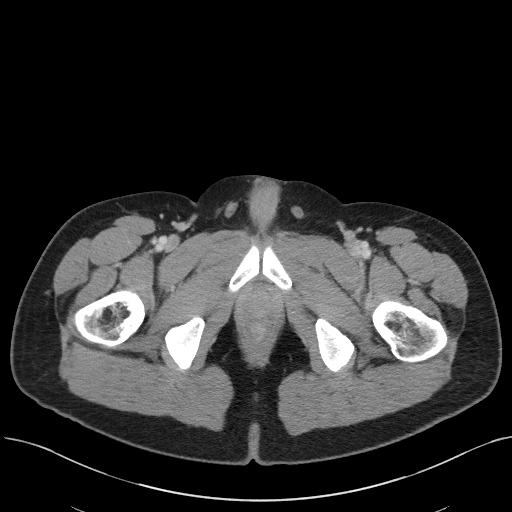
[im 23/109  soft-tissue]
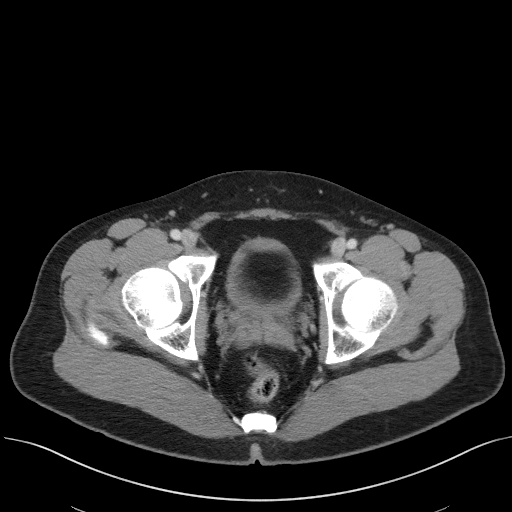
[im 32/109  soft-tissue]
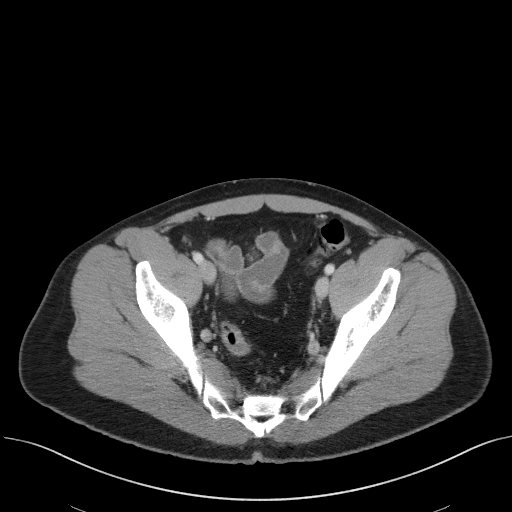
[im 37/109  soft-tissue]
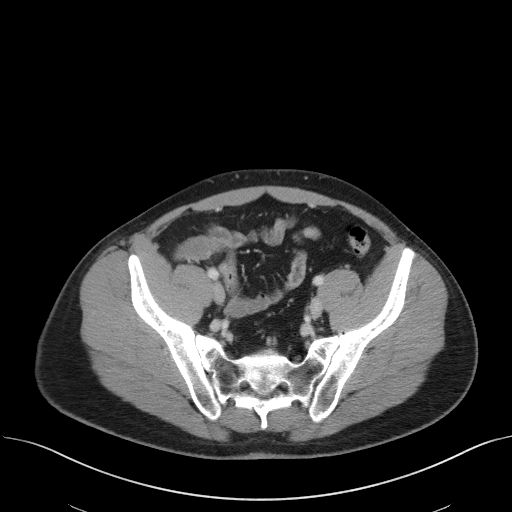
[im 46/109  soft-tissue]
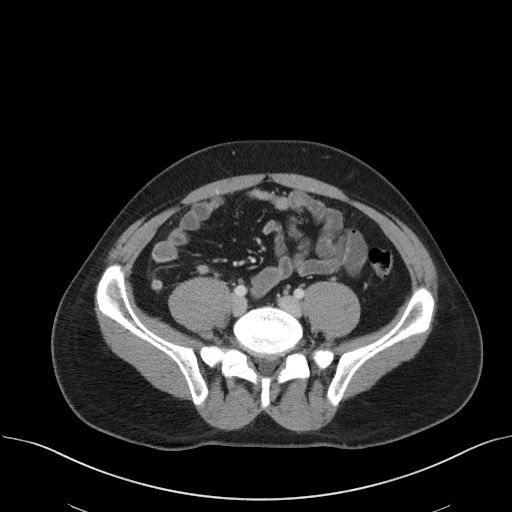
[im 55/109  soft-tissue]
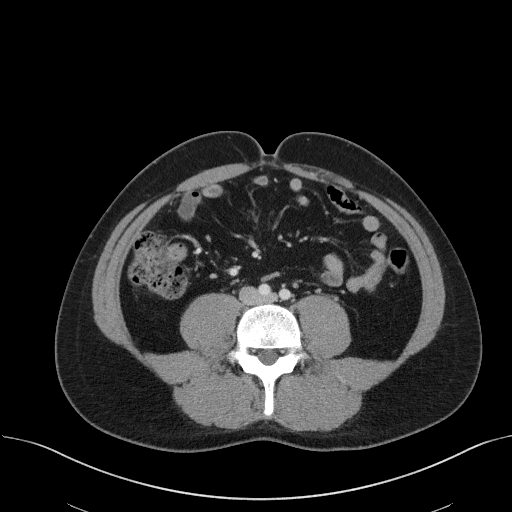
[im 64/109  soft-tissue]
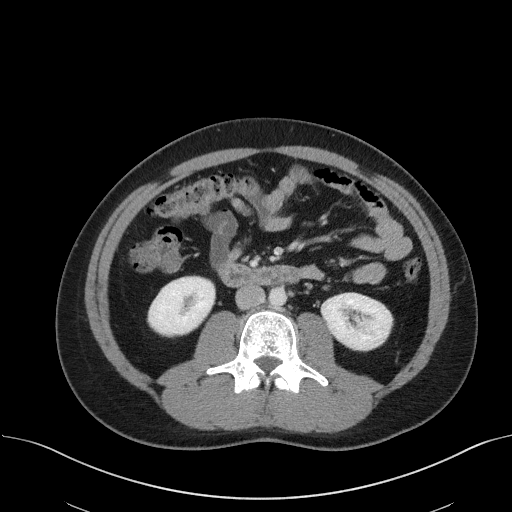
[im 73/109  soft-tissue]
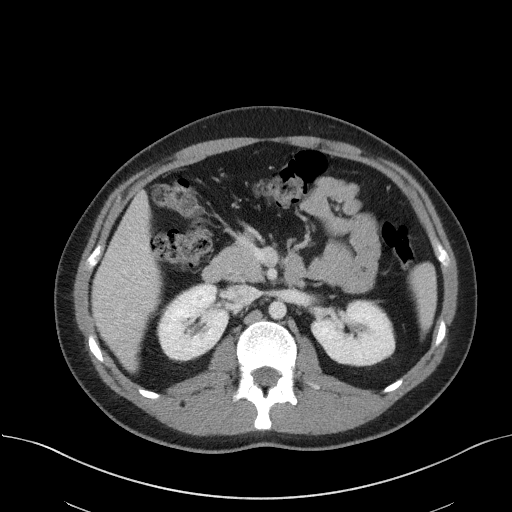
[im 73/109  bone]
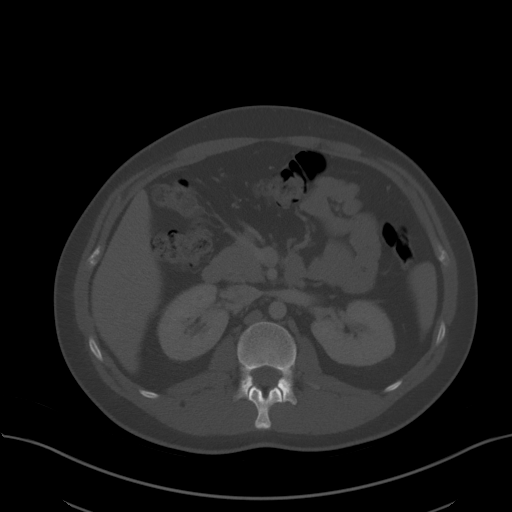
[im 77/109  soft-tissue]
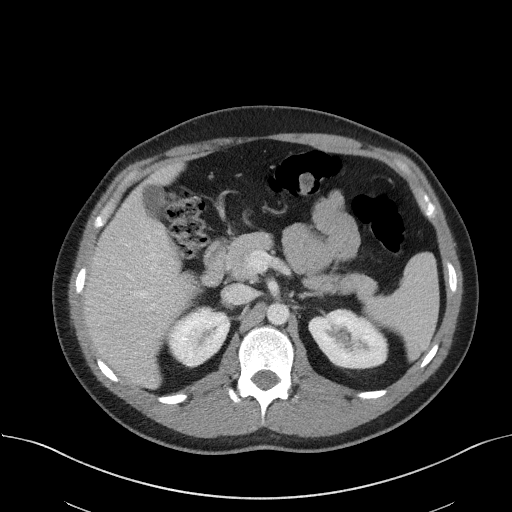
[im 86/109  soft-tissue]
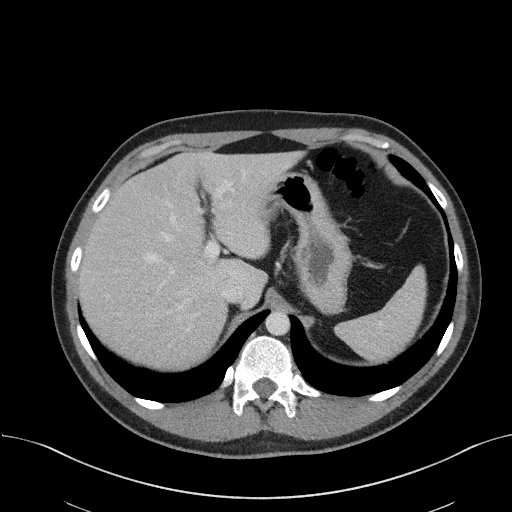
[im 95/109  soft-tissue]
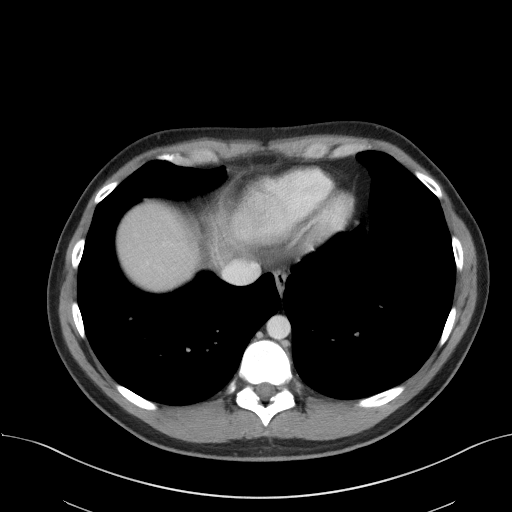
[im 104/109  soft-tissue]
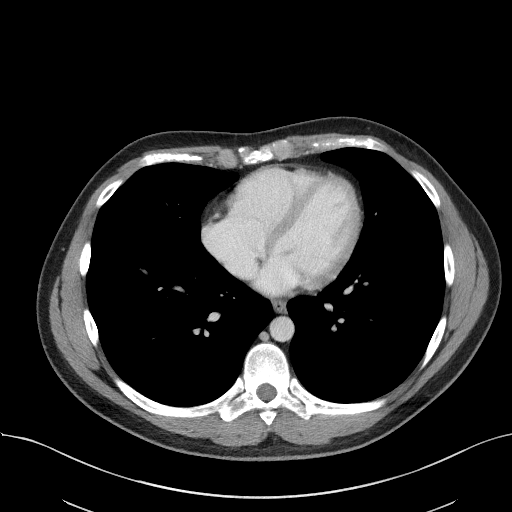

[Series 5: coronal st · coronal · 0.78mm/px · 3 of 97 slices shown]
[im 33/97  soft-tissue]
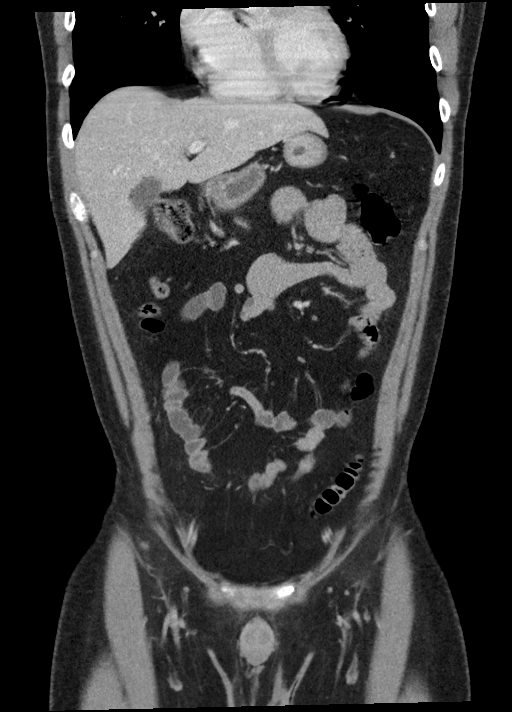
[im 43/97  soft-tissue]
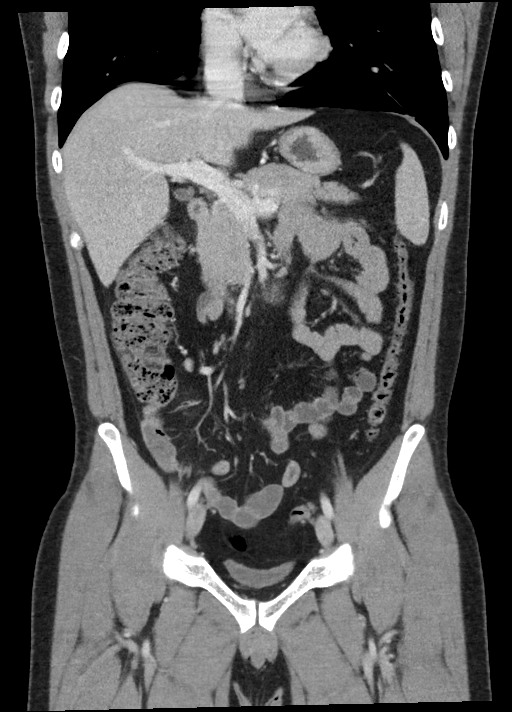
[im 54/97  soft-tissue]
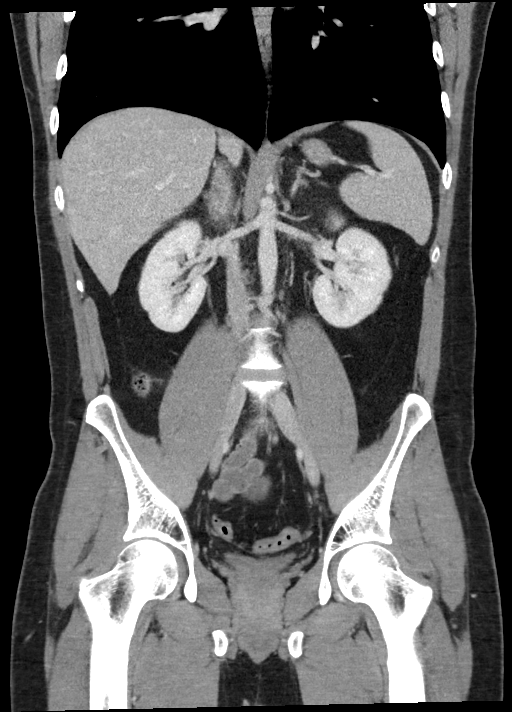

[16 of 46 positions shown; findings below may reference images not displayed]

FINDINGS: Lower chest: Negative.

Hepatobiliary: Negative liver and gallbladder.

Pancreas: Negative.

Spleen: Negative.

Adrenals/Urinary Tract: Negative. No urinary calculus. Incidental
right hemipelvis phlebolith.

Stomach/Bowel: Negative large bowel from the rectum to the ascending
colon aside from mild redundancy.

The appendix is located medial and caudal to the cecum, does not
contain any gas, and demonstrates suggestion of mucosal
hyperenhancement (series 5, image 45).

Appendix: Location: Medial and caudal to the cecum

Diameter: 8-9 mm at the tip.  7 mm at the base.

Appendicolith: None

Mucosal hyper-enhancement: Positive

Extraluminal gas: None

Periappendiceal collection: None. Minimal Peri appendiceal
inflammatory stranding.

Negative terminal ileum. No distal small bowel inflammation. No
dilated small bowel. Decompressed stomach and duodenum. No free air.
No free fluid.

Vascular/Lymphatic: Major arterial structures are patent and normal.
Portal venous system appears to be patent.

No lymphadenopathy.

Reproductive: Negative.

Other: No pelvic free fluid.

Musculoskeletal: Negative.
IMPRESSION: 1. Compatible with early Acute Appendicitis: airless, slightly
enlarged, and minimally inflamed appendix. No complicating features.
2. Otherwise negative CT Abdomen and Pelvis.

## 2021-08-20 ENCOUNTER — Encounter: Payer: Self-pay | Admitting: General Surgery

## 2022-08-28 ENCOUNTER — Other Ambulatory Visit: Payer: Self-pay | Admitting: Physician Assistant

## 2022-08-28 ENCOUNTER — Ambulatory Visit
Admission: RE | Admit: 2022-08-28 | Discharge: 2022-08-28 | Disposition: A | Discharge status: Home / Self Care | Payer: Worker's Compensation | Source: Ambulatory Visit | Attending: Physician Assistant | Admitting: Physician Assistant

## 2022-08-28 DIAGNOSIS — S8992XA Unspecified injury of left lower leg, initial encounter: Secondary | ICD-10-CM | POA: Diagnosis not present
